# Patient Record
Sex: Female | Born: 1945 | Race: White | Hispanic: No | Smoking: Never smoker
Health system: Southern US, Community
[De-identification: ages and names within clinical notes are randomized; demographics above are authoritative.]

## PROBLEM LIST (undated history)

## (undated) DIAGNOSIS — C801 Malignant (primary) neoplasm, unspecified: Secondary | ICD-10-CM

## (undated) DIAGNOSIS — G809 Cerebral palsy, unspecified: Secondary | ICD-10-CM

## (undated) DIAGNOSIS — C50919 Malignant neoplasm of unspecified site of unspecified female breast: Secondary | ICD-10-CM

## (undated) HISTORY — PX: FRACTURE SURGERY: SHX138

## (undated) HISTORY — PX: ABDOMINAL HYSTERECTOMY: SHX81

## (undated) HISTORY — DX: Malignant (primary) neoplasm, unspecified: C80.1

## (undated) HISTORY — DX: Cerebral palsy, unspecified: G80.9

## (undated) HISTORY — PX: TUBAL LIGATION: SHX77

## (undated) HISTORY — PX: CATARACT EXTRACTION: SUR2

---

## 2008-04-28 DIAGNOSIS — C50919 Malignant neoplasm of unspecified site of unspecified female breast: Secondary | ICD-10-CM

## 2008-04-28 DIAGNOSIS — C801 Malignant (primary) neoplasm, unspecified: Secondary | ICD-10-CM

## 2008-04-28 HISTORY — PX: BREAST SURGERY: SHX581

## 2008-04-28 HISTORY — PX: BREAST LUMPECTOMY: SHX2

## 2008-04-28 HISTORY — DX: Malignant neoplasm of unspecified site of unspecified female breast: C50.919

## 2008-04-28 HISTORY — DX: Malignant (primary) neoplasm, unspecified: C80.1

## 2008-11-09 ENCOUNTER — Ambulatory Visit: Payer: Self-pay | Admitting: General Surgery

## 2008-11-16 ENCOUNTER — Ambulatory Visit: Payer: Self-pay | Admitting: General Surgery

## 2008-11-26 ENCOUNTER — Ambulatory Visit: Payer: Self-pay | Admitting: Oncology

## 2008-11-27 ENCOUNTER — Ambulatory Visit: Payer: Self-pay | Admitting: Internal Medicine

## 2008-12-27 ENCOUNTER — Ambulatory Visit: Payer: Self-pay | Admitting: Oncology

## 2008-12-27 ENCOUNTER — Ambulatory Visit: Payer: Self-pay | Admitting: Internal Medicine

## 2009-01-26 ENCOUNTER — Ambulatory Visit: Payer: Self-pay | Admitting: Oncology

## 2009-01-29 ENCOUNTER — Ambulatory Visit: Payer: Self-pay | Admitting: Oncology

## 2009-02-26 ENCOUNTER — Ambulatory Visit: Payer: Self-pay | Admitting: Oncology

## 2009-03-28 ENCOUNTER — Ambulatory Visit: Payer: Self-pay | Admitting: Oncology

## 2009-04-26 ENCOUNTER — Ambulatory Visit: Payer: Self-pay | Admitting: Oncology

## 2009-04-28 ENCOUNTER — Ambulatory Visit: Payer: Self-pay | Admitting: Oncology

## 2009-06-26 ENCOUNTER — Ambulatory Visit: Payer: Self-pay | Admitting: Oncology

## 2009-07-12 ENCOUNTER — Ambulatory Visit: Payer: Self-pay | Admitting: Oncology

## 2009-07-27 ENCOUNTER — Ambulatory Visit: Payer: Self-pay | Admitting: Oncology

## 2009-10-26 ENCOUNTER — Ambulatory Visit: Payer: Self-pay | Admitting: Oncology

## 2009-11-13 ENCOUNTER — Ambulatory Visit: Payer: Self-pay | Admitting: Oncology

## 2009-11-15 LAB — CANCER ANTIGEN 27.29: CA 27.29: 22 U/mL (ref 0.0–38.6)

## 2009-11-26 ENCOUNTER — Ambulatory Visit: Payer: Self-pay | Admitting: Oncology

## 2010-05-21 ENCOUNTER — Ambulatory Visit: Payer: Self-pay | Admitting: Oncology

## 2010-05-22 LAB — CANCER ANTIGEN 27.29: CA 27.29: 26.2 U/mL (ref 0.0–38.6)

## 2010-05-29 ENCOUNTER — Ambulatory Visit: Payer: Self-pay | Admitting: Oncology

## 2010-06-18 ENCOUNTER — Ambulatory Visit: Payer: Self-pay | Admitting: General Surgery

## 2010-07-08 ENCOUNTER — Ambulatory Visit: Payer: Self-pay | Admitting: Oncology

## 2010-07-28 ENCOUNTER — Ambulatory Visit: Payer: Self-pay | Admitting: Oncology

## 2010-11-19 ENCOUNTER — Ambulatory Visit: Payer: Self-pay | Admitting: Oncology

## 2010-11-27 ENCOUNTER — Ambulatory Visit: Payer: Self-pay | Admitting: Oncology

## 2010-12-17 ENCOUNTER — Ambulatory Visit: Payer: Self-pay | Admitting: General Surgery

## 2011-05-20 ENCOUNTER — Ambulatory Visit: Payer: Self-pay | Admitting: Oncology

## 2011-05-20 DIAGNOSIS — G808 Other cerebral palsy: Secondary | ICD-10-CM | POA: Diagnosis not present

## 2011-05-20 DIAGNOSIS — Z79811 Long term (current) use of aromatase inhibitors: Secondary | ICD-10-CM | POA: Diagnosis not present

## 2011-05-20 DIAGNOSIS — G114 Hereditary spastic paraplegia: Secondary | ICD-10-CM | POA: Diagnosis not present

## 2011-05-20 DIAGNOSIS — Z170 Estrogen receptor positive status [ER+]: Secondary | ICD-10-CM | POA: Diagnosis not present

## 2011-05-20 DIAGNOSIS — C50919 Malignant neoplasm of unspecified site of unspecified female breast: Secondary | ICD-10-CM | POA: Diagnosis not present

## 2011-05-20 DIAGNOSIS — Z79899 Other long term (current) drug therapy: Secondary | ICD-10-CM | POA: Diagnosis not present

## 2011-05-20 DIAGNOSIS — Z7982 Long term (current) use of aspirin: Secondary | ICD-10-CM | POA: Diagnosis not present

## 2011-05-30 ENCOUNTER — Ambulatory Visit: Payer: Self-pay | Admitting: Oncology

## 2011-06-04 DIAGNOSIS — Z853 Personal history of malignant neoplasm of breast: Secondary | ICD-10-CM | POA: Diagnosis not present

## 2011-06-04 DIAGNOSIS — Z09 Encounter for follow-up examination after completed treatment for conditions other than malignant neoplasm: Secondary | ICD-10-CM | POA: Diagnosis not present

## 2011-06-17 DIAGNOSIS — Z853 Personal history of malignant neoplasm of breast: Secondary | ICD-10-CM | POA: Diagnosis not present

## 2011-06-19 DIAGNOSIS — Z01818 Encounter for other preprocedural examination: Secondary | ICD-10-CM | POA: Diagnosis not present

## 2011-06-19 DIAGNOSIS — G808 Other cerebral palsy: Secondary | ICD-10-CM | POA: Diagnosis not present

## 2011-06-19 DIAGNOSIS — M24559 Contracture, unspecified hip: Secondary | ICD-10-CM | POA: Diagnosis not present

## 2011-06-19 DIAGNOSIS — Z01810 Encounter for preprocedural cardiovascular examination: Secondary | ICD-10-CM | POA: Diagnosis not present

## 2011-06-19 DIAGNOSIS — I10 Essential (primary) hypertension: Secondary | ICD-10-CM | POA: Diagnosis not present

## 2011-06-24 DIAGNOSIS — M6240 Contracture of muscle, unspecified site: Secondary | ICD-10-CM | POA: Diagnosis not present

## 2011-06-24 DIAGNOSIS — Z853 Personal history of malignant neoplasm of breast: Secondary | ICD-10-CM | POA: Diagnosis not present

## 2011-06-24 DIAGNOSIS — G809 Cerebral palsy, unspecified: Secondary | ICD-10-CM | POA: Diagnosis not present

## 2011-06-24 DIAGNOSIS — G8190 Hemiplegia, unspecified affecting unspecified side: Secondary | ICD-10-CM | POA: Diagnosis not present

## 2011-06-24 DIAGNOSIS — M24559 Contracture, unspecified hip: Secondary | ICD-10-CM | POA: Diagnosis not present

## 2011-06-24 DIAGNOSIS — I10 Essential (primary) hypertension: Secondary | ICD-10-CM | POA: Diagnosis not present

## 2011-06-24 DIAGNOSIS — G808 Other cerebral palsy: Secondary | ICD-10-CM | POA: Diagnosis not present

## 2011-06-24 DIAGNOSIS — Z79899 Other long term (current) drug therapy: Secondary | ICD-10-CM | POA: Diagnosis not present

## 2011-07-11 DIAGNOSIS — G808 Other cerebral palsy: Secondary | ICD-10-CM | POA: Diagnosis not present

## 2011-07-21 DIAGNOSIS — I69959 Hemiplegia and hemiparesis following unspecified cerebrovascular disease affecting unspecified side: Secondary | ICD-10-CM | POA: Diagnosis not present

## 2011-07-21 DIAGNOSIS — M62838 Other muscle spasm: Secondary | ICD-10-CM | POA: Diagnosis not present

## 2011-09-01 DIAGNOSIS — M2170 Unequal limb length (acquired), unspecified site: Secondary | ICD-10-CM | POA: Diagnosis not present

## 2011-09-01 DIAGNOSIS — G808 Other cerebral palsy: Secondary | ICD-10-CM | POA: Diagnosis not present

## 2011-09-01 DIAGNOSIS — M24559 Contracture, unspecified hip: Secondary | ICD-10-CM | POA: Diagnosis not present

## 2011-10-10 DIAGNOSIS — Z0000 Encounter for general adult medical examination without abnormal findings: Secondary | ICD-10-CM | POA: Diagnosis not present

## 2011-10-10 DIAGNOSIS — I10 Essential (primary) hypertension: Secondary | ICD-10-CM | POA: Diagnosis not present

## 2011-10-10 DIAGNOSIS — C50919 Malignant neoplasm of unspecified site of unspecified female breast: Secondary | ICD-10-CM | POA: Diagnosis not present

## 2011-10-10 DIAGNOSIS — G808 Other cerebral palsy: Secondary | ICD-10-CM | POA: Diagnosis not present

## 2011-11-05 DIAGNOSIS — I10 Essential (primary) hypertension: Secondary | ICD-10-CM | POA: Diagnosis not present

## 2011-11-05 DIAGNOSIS — Z0000 Encounter for general adult medical examination without abnormal findings: Secondary | ICD-10-CM | POA: Diagnosis not present

## 2011-12-08 DIAGNOSIS — I10 Essential (primary) hypertension: Secondary | ICD-10-CM | POA: Diagnosis not present

## 2012-01-05 DIAGNOSIS — Z23 Encounter for immunization: Secondary | ICD-10-CM | POA: Diagnosis not present

## 2012-01-20 ENCOUNTER — Ambulatory Visit: Payer: Self-pay | Admitting: Oncology

## 2012-01-20 DIAGNOSIS — Z79899 Other long term (current) drug therapy: Secondary | ICD-10-CM | POA: Diagnosis not present

## 2012-01-20 DIAGNOSIS — G808 Other cerebral palsy: Secondary | ICD-10-CM | POA: Diagnosis not present

## 2012-01-20 DIAGNOSIS — Z7982 Long term (current) use of aspirin: Secondary | ICD-10-CM | POA: Diagnosis not present

## 2012-01-20 DIAGNOSIS — C50919 Malignant neoplasm of unspecified site of unspecified female breast: Secondary | ICD-10-CM | POA: Diagnosis not present

## 2012-01-20 DIAGNOSIS — Z170 Estrogen receptor positive status [ER+]: Secondary | ICD-10-CM | POA: Diagnosis not present

## 2012-01-20 DIAGNOSIS — Z79811 Long term (current) use of aromatase inhibitors: Secondary | ICD-10-CM | POA: Diagnosis not present

## 2012-01-22 DIAGNOSIS — G809 Cerebral palsy, unspecified: Secondary | ICD-10-CM | POA: Diagnosis not present

## 2012-01-27 ENCOUNTER — Ambulatory Visit: Payer: Self-pay | Admitting: Oncology

## 2012-04-08 DIAGNOSIS — I10 Essential (primary) hypertension: Secondary | ICD-10-CM | POA: Diagnosis not present

## 2012-05-10 DIAGNOSIS — I10 Essential (primary) hypertension: Secondary | ICD-10-CM | POA: Diagnosis not present

## 2012-06-08 DIAGNOSIS — Z09 Encounter for follow-up examination after completed treatment for conditions other than malignant neoplasm: Secondary | ICD-10-CM | POA: Diagnosis not present

## 2012-06-08 DIAGNOSIS — Z853 Personal history of malignant neoplasm of breast: Secondary | ICD-10-CM | POA: Diagnosis not present

## 2012-06-15 DIAGNOSIS — Z853 Personal history of malignant neoplasm of breast: Secondary | ICD-10-CM | POA: Diagnosis not present

## 2012-07-19 ENCOUNTER — Ambulatory Visit: Payer: Self-pay | Admitting: Oncology

## 2012-07-19 DIAGNOSIS — C50919 Malignant neoplasm of unspecified site of unspecified female breast: Secondary | ICD-10-CM | POA: Diagnosis not present

## 2012-07-19 DIAGNOSIS — Z90710 Acquired absence of both cervix and uterus: Secondary | ICD-10-CM | POA: Diagnosis not present

## 2012-07-19 DIAGNOSIS — Z170 Estrogen receptor positive status [ER+]: Secondary | ICD-10-CM | POA: Diagnosis not present

## 2012-07-19 DIAGNOSIS — Z79811 Long term (current) use of aromatase inhibitors: Secondary | ICD-10-CM | POA: Diagnosis not present

## 2012-07-19 DIAGNOSIS — G808 Other cerebral palsy: Secondary | ICD-10-CM | POA: Diagnosis not present

## 2012-07-19 DIAGNOSIS — Z7982 Long term (current) use of aspirin: Secondary | ICD-10-CM | POA: Diagnosis not present

## 2012-07-19 DIAGNOSIS — G809 Cerebral palsy, unspecified: Secondary | ICD-10-CM | POA: Diagnosis not present

## 2012-07-19 DIAGNOSIS — Z79899 Other long term (current) drug therapy: Secondary | ICD-10-CM | POA: Diagnosis not present

## 2012-07-20 DIAGNOSIS — Z170 Estrogen receptor positive status [ER+]: Secondary | ICD-10-CM | POA: Diagnosis not present

## 2012-07-20 DIAGNOSIS — C50919 Malignant neoplasm of unspecified site of unspecified female breast: Secondary | ICD-10-CM | POA: Diagnosis not present

## 2012-07-20 DIAGNOSIS — Z79811 Long term (current) use of aromatase inhibitors: Secondary | ICD-10-CM | POA: Diagnosis not present

## 2012-07-27 ENCOUNTER — Ambulatory Visit: Payer: Self-pay | Admitting: Oncology

## 2012-10-27 ENCOUNTER — Encounter: Payer: Self-pay | Admitting: *Deleted

## 2012-10-27 DIAGNOSIS — C50419 Malignant neoplasm of upper-outer quadrant of unspecified female breast: Secondary | ICD-10-CM | POA: Insufficient documentation

## 2012-11-17 DIAGNOSIS — Z0000 Encounter for general adult medical examination without abnormal findings: Secondary | ICD-10-CM | POA: Diagnosis not present

## 2012-11-17 DIAGNOSIS — I10 Essential (primary) hypertension: Secondary | ICD-10-CM | POA: Diagnosis not present

## 2012-11-18 DIAGNOSIS — G808 Other cerebral palsy: Secondary | ICD-10-CM | POA: Diagnosis not present

## 2012-11-18 DIAGNOSIS — Z0000 Encounter for general adult medical examination without abnormal findings: Secondary | ICD-10-CM | POA: Diagnosis not present

## 2012-11-23 DIAGNOSIS — H5210 Myopia, unspecified eye: Secondary | ICD-10-CM | POA: Diagnosis not present

## 2012-11-23 DIAGNOSIS — H524 Presbyopia: Secondary | ICD-10-CM | POA: Diagnosis not present

## 2012-11-23 DIAGNOSIS — H52229 Regular astigmatism, unspecified eye: Secondary | ICD-10-CM | POA: Diagnosis not present

## 2012-11-23 DIAGNOSIS — H269 Unspecified cataract: Secondary | ICD-10-CM | POA: Diagnosis not present

## 2013-01-10 DIAGNOSIS — M62838 Other muscle spasm: Secondary | ICD-10-CM | POA: Diagnosis not present

## 2013-01-10 DIAGNOSIS — R259 Unspecified abnormal involuntary movements: Secondary | ICD-10-CM | POA: Diagnosis not present

## 2013-01-10 DIAGNOSIS — G809 Cerebral palsy, unspecified: Secondary | ICD-10-CM | POA: Diagnosis not present

## 2013-01-12 DIAGNOSIS — Z23 Encounter for immunization: Secondary | ICD-10-CM | POA: Diagnosis not present

## 2013-01-17 ENCOUNTER — Ambulatory Visit: Payer: Self-pay | Admitting: Oncology

## 2013-01-17 DIAGNOSIS — Z90710 Acquired absence of both cervix and uterus: Secondary | ICD-10-CM | POA: Diagnosis not present

## 2013-01-17 DIAGNOSIS — Z170 Estrogen receptor positive status [ER+]: Secondary | ICD-10-CM | POA: Diagnosis not present

## 2013-01-17 DIAGNOSIS — Z79899 Other long term (current) drug therapy: Secondary | ICD-10-CM | POA: Diagnosis not present

## 2013-01-17 DIAGNOSIS — G809 Cerebral palsy, unspecified: Secondary | ICD-10-CM | POA: Diagnosis not present

## 2013-01-17 DIAGNOSIS — C50919 Malignant neoplasm of unspecified site of unspecified female breast: Secondary | ICD-10-CM | POA: Diagnosis not present

## 2013-01-17 DIAGNOSIS — Z7982 Long term (current) use of aspirin: Secondary | ICD-10-CM | POA: Diagnosis not present

## 2013-01-17 DIAGNOSIS — Z79811 Long term (current) use of aromatase inhibitors: Secondary | ICD-10-CM | POA: Diagnosis not present

## 2013-01-18 DIAGNOSIS — Z170 Estrogen receptor positive status [ER+]: Secondary | ICD-10-CM | POA: Diagnosis not present

## 2013-01-18 DIAGNOSIS — Z79811 Long term (current) use of aromatase inhibitors: Secondary | ICD-10-CM | POA: Diagnosis not present

## 2013-01-18 DIAGNOSIS — C50919 Malignant neoplasm of unspecified site of unspecified female breast: Secondary | ICD-10-CM | POA: Diagnosis not present

## 2013-01-26 ENCOUNTER — Ambulatory Visit: Payer: Self-pay | Admitting: Oncology

## 2013-05-13 DIAGNOSIS — I10 Essential (primary) hypertension: Secondary | ICD-10-CM | POA: Diagnosis not present

## 2013-06-16 ENCOUNTER — Ambulatory Visit: Payer: Self-pay | Admitting: General Surgery

## 2013-06-29 ENCOUNTER — Ambulatory Visit: Payer: Medicaid Other | Admitting: General Surgery

## 2013-06-30 DIAGNOSIS — Z9889 Other specified postprocedural states: Secondary | ICD-10-CM | POA: Diagnosis not present

## 2013-06-30 DIAGNOSIS — Z853 Personal history of malignant neoplasm of breast: Secondary | ICD-10-CM | POA: Diagnosis not present

## 2013-06-30 DIAGNOSIS — G809 Cerebral palsy, unspecified: Secondary | ICD-10-CM | POA: Diagnosis not present

## 2013-06-30 DIAGNOSIS — Z09 Encounter for follow-up examination after completed treatment for conditions other than malignant neoplasm: Secondary | ICD-10-CM | POA: Diagnosis not present

## 2013-07-01 ENCOUNTER — Encounter: Payer: Self-pay | Admitting: General Surgery

## 2013-07-06 ENCOUNTER — Encounter: Payer: Self-pay | Admitting: General Surgery

## 2013-07-06 ENCOUNTER — Ambulatory Visit (INDEPENDENT_AMBULATORY_CARE_PROVIDER_SITE_OTHER): Payer: Medicare Other | Admitting: General Surgery

## 2013-07-06 VITALS — BP 116/72 | HR 80 | Resp 16 | Ht 62.0 in | Wt 108.0 lb

## 2013-07-06 DIAGNOSIS — C50419 Malignant neoplasm of upper-outer quadrant of unspecified female breast: Secondary | ICD-10-CM

## 2013-07-06 DIAGNOSIS — C50411 Malignant neoplasm of upper-outer quadrant of right female breast: Secondary | ICD-10-CM

## 2013-07-06 DIAGNOSIS — Z853 Personal history of malignant neoplasm of breast: Secondary | ICD-10-CM

## 2013-07-06 NOTE — Progress Notes (Signed)
[name] ID: Suzanne Jordan, female   DOB: 1946-02-22, 68 y.o.   MRN: [id number]  Chief Complaint  Patient presents with  . Follow-up    mammogram    HPI Suzanne Jordan is a 68 y.o. female who presents for a breast evaluation. The most recent mammogram was done on 06/30/13. Patient does perform regular self breast checks and gets regular mammograms done.  No new breast complaints. Bowels move daily, denies bloody stools.  HPI  Past Medical History  Diagnosis Date  . Cancer 2010    right breast managed by lumpectomy, sn bx and partial breast radiation from July-Sept 2013. 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 On wide excision there was a 1.5cm area of invasive cancer. ER & PR testing was positive. Oncotype Dx assay showed a low recurrence risk of 10%  . Hypertension 2012  . Cerebral palsy     at birth  . 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 Breast surgery Right 2010    wide excision with sn biopsy  . Fracture surgery Right     right leg, as a child  . Abdominal hysterectomy  1970's  . Tubal ligation      Family History  Problem Relation Age of Onset  . Stroke Father     passed Jan 2014 age 76  . Cancer Other     colon,breast,ovarian cancers, relationships not listed    Social History History  Substance Use Topics  . Smoking status: Never Smoker   . Smokeless tobacco: Never Used  . Alcohol Use: No    No Known Allergies  Current Outpatient Prescriptions  Medication Sig Dispense Refill  . aspirin 81 MG tablet Take 81 mg by mouth daily.      . benazepril (LOTENSIN) 40 MG tablet       . diazepam (VALIUM) 10 MG tablet       . hydrochlorothiazide (MICROZIDE) 12.5 MG capsule       . Multiple Vitamin (MULTIVITAMIN) capsule Take 1 capsule by mouth daily.      . verapamil (CALAN-SR) 240 MG CR tablet        No current facility-administered medications for this visit.     Review of Systems Review of Systems  Constitutional: Negative.   Respiratory: Negative.   Cardiovascular: Negative.     Blood pressure 116/72, pulse 80, resp. rate 16, height 5\' 2"  (1.575 m), weight 108 lb (48.988 kg).  Physical Exam Physical Exam  Constitutional: She is oriented to person, place, and time. She appears well-nourished.  Neck: Neck supple.  Cardiovascular: Normal rate, regular rhythm and normal heart sounds.   Pulmonary/Chest: Effort normal and breath sounds normal. Right breast exhibits no inverted nipple, no mass, no nipple discharge and no tenderness. Left breast exhibits no inverted nipple, no mass, no nipple discharge, no skin change and no tenderness.  3 x 3 area of telangectasia  Moderate volume loss left breast  Lymphadenopathy:    She has no cervical adenopathy.    She has no axillary adenopathy.  Neurological: She is alert and oriented to person, place, and time.  Skin: Skin is warm and dry.    Data Reviewed Bilateral mammograms date June 30, 2013 were completed, the examination was compromised due the patient's inability to stand. These images were reviewed.  BI-RAD-2.  Assessment    No evident recurrent disease now 5-1/2 years after breast conservation.  Plan    We'll plan for repeat examination with bilateral diagnostic mammograms in one year.  The patient declined to consider antiestrogen therapy. She also declined to consider a colonoscopy. Her increased risk based on her age and previous breast cancer was discussed with her.         Robert Bellow 07/09/2013, 3:18 PM   A

## 2013-07-06 NOTE — Patient Instructions (Addendum)
Patient to return in one year bilateral diagnotic mammogram.  Continue self breast exams. Call office for any new breast issues or concerns.  

## 2013-07-07 DIAGNOSIS — G809 Cerebral palsy, unspecified: Secondary | ICD-10-CM | POA: Diagnosis not present

## 2013-07-07 DIAGNOSIS — R259 Unspecified abnormal involuntary movements: Secondary | ICD-10-CM | POA: Diagnosis not present

## 2013-07-07 DIAGNOSIS — M62838 Other muscle spasm: Secondary | ICD-10-CM | POA: Diagnosis not present

## 2013-07-09 ENCOUNTER — Encounter: Payer: Self-pay | Admitting: General Surgery

## 2013-07-11 ENCOUNTER — Ambulatory Visit: Payer: Self-pay | Admitting: Oncology

## 2013-07-11 DIAGNOSIS — Z170 Estrogen receptor positive status [ER+]: Secondary | ICD-10-CM | POA: Diagnosis not present

## 2013-07-11 DIAGNOSIS — Z90710 Acquired absence of both cervix and uterus: Secondary | ICD-10-CM | POA: Diagnosis not present

## 2013-07-11 DIAGNOSIS — Z79899 Other long term (current) drug therapy: Secondary | ICD-10-CM | POA: Diagnosis not present

## 2013-07-11 DIAGNOSIS — Z7982 Long term (current) use of aspirin: Secondary | ICD-10-CM | POA: Diagnosis not present

## 2013-07-11 DIAGNOSIS — C50919 Malignant neoplasm of unspecified site of unspecified female breast: Secondary | ICD-10-CM | POA: Diagnosis not present

## 2013-07-11 DIAGNOSIS — G809 Cerebral palsy, unspecified: Secondary | ICD-10-CM | POA: Diagnosis not present

## 2013-07-11 DIAGNOSIS — Z79811 Long term (current) use of aromatase inhibitors: Secondary | ICD-10-CM | POA: Diagnosis not present

## 2013-07-14 ENCOUNTER — Encounter: Payer: Self-pay | Admitting: General Surgery

## 2013-07-14 DIAGNOSIS — Z79899 Other long term (current) drug therapy: Secondary | ICD-10-CM | POA: Diagnosis not present

## 2013-07-14 DIAGNOSIS — Z7982 Long term (current) use of aspirin: Secondary | ICD-10-CM | POA: Diagnosis not present

## 2013-07-14 DIAGNOSIS — G809 Cerebral palsy, unspecified: Secondary | ICD-10-CM | POA: Diagnosis not present

## 2013-07-14 DIAGNOSIS — Z79811 Long term (current) use of aromatase inhibitors: Secondary | ICD-10-CM | POA: Diagnosis not present

## 2013-07-14 DIAGNOSIS — Z170 Estrogen receptor positive status [ER+]: Secondary | ICD-10-CM | POA: Diagnosis not present

## 2013-07-14 DIAGNOSIS — C50919 Malignant neoplasm of unspecified site of unspecified female breast: Secondary | ICD-10-CM | POA: Diagnosis not present

## 2013-07-21 DIAGNOSIS — Z462 Encounter for fitting and adjustment of other devices related to nervous system and special senses: Secondary | ICD-10-CM | POA: Diagnosis not present

## 2013-07-21 DIAGNOSIS — C50919 Malignant neoplasm of unspecified site of unspecified female breast: Secondary | ICD-10-CM | POA: Diagnosis not present

## 2013-07-21 DIAGNOSIS — R259 Unspecified abnormal involuntary movements: Secondary | ICD-10-CM | POA: Diagnosis not present

## 2013-07-21 DIAGNOSIS — G808 Other cerebral palsy: Secondary | ICD-10-CM | POA: Diagnosis not present

## 2013-07-21 DIAGNOSIS — Z419 Encounter for procedure for purposes other than remedying health state, unspecified: Secondary | ICD-10-CM | POA: Diagnosis not present

## 2013-07-21 DIAGNOSIS — G809 Cerebral palsy, unspecified: Secondary | ICD-10-CM | POA: Diagnosis not present

## 2013-07-21 DIAGNOSIS — I10 Essential (primary) hypertension: Secondary | ICD-10-CM | POA: Diagnosis not present

## 2013-07-21 DIAGNOSIS — M62838 Other muscle spasm: Secondary | ICD-10-CM | POA: Diagnosis not present

## 2013-07-21 DIAGNOSIS — Z79899 Other long term (current) drug therapy: Secondary | ICD-10-CM | POA: Diagnosis not present

## 2013-07-27 ENCOUNTER — Ambulatory Visit: Payer: Self-pay | Admitting: Oncology

## 2013-08-03 DIAGNOSIS — M62838 Other muscle spasm: Secondary | ICD-10-CM | POA: Diagnosis not present

## 2013-08-03 DIAGNOSIS — I10 Essential (primary) hypertension: Secondary | ICD-10-CM | POA: Diagnosis not present

## 2013-08-03 DIAGNOSIS — G809 Cerebral palsy, unspecified: Secondary | ICD-10-CM | POA: Diagnosis not present

## 2013-08-03 DIAGNOSIS — R259 Unspecified abnormal involuntary movements: Secondary | ICD-10-CM | POA: Diagnosis not present

## 2013-08-24 DIAGNOSIS — Z5189 Encounter for other specified aftercare: Secondary | ICD-10-CM | POA: Diagnosis not present

## 2013-11-23 DIAGNOSIS — B351 Tinea unguium: Secondary | ICD-10-CM | POA: Diagnosis not present

## 2013-11-23 DIAGNOSIS — I10 Essential (primary) hypertension: Secondary | ICD-10-CM | POA: Diagnosis not present

## 2013-11-23 DIAGNOSIS — Z0000 Encounter for general adult medical examination without abnormal findings: Secondary | ICD-10-CM | POA: Diagnosis not present

## 2013-11-23 DIAGNOSIS — Z23 Encounter for immunization: Secondary | ICD-10-CM | POA: Diagnosis not present

## 2013-11-23 DIAGNOSIS — G808 Other cerebral palsy: Secondary | ICD-10-CM | POA: Diagnosis not present

## 2014-01-06 DIAGNOSIS — G808 Other cerebral palsy: Secondary | ICD-10-CM | POA: Diagnosis not present

## 2014-01-06 DIAGNOSIS — R259 Unspecified abnormal involuntary movements: Secondary | ICD-10-CM | POA: Diagnosis not present

## 2014-01-17 ENCOUNTER — Ambulatory Visit: Payer: Self-pay | Admitting: Oncology

## 2014-01-17 DIAGNOSIS — C50919 Malignant neoplasm of unspecified site of unspecified female breast: Secondary | ICD-10-CM | POA: Diagnosis not present

## 2014-01-17 DIAGNOSIS — Z170 Estrogen receptor positive status [ER+]: Secondary | ICD-10-CM | POA: Diagnosis not present

## 2014-01-17 DIAGNOSIS — Z90710 Acquired absence of both cervix and uterus: Secondary | ICD-10-CM | POA: Diagnosis not present

## 2014-01-17 DIAGNOSIS — G809 Cerebral palsy, unspecified: Secondary | ICD-10-CM | POA: Diagnosis not present

## 2014-01-17 DIAGNOSIS — Z7982 Long term (current) use of aspirin: Secondary | ICD-10-CM | POA: Diagnosis not present

## 2014-01-17 DIAGNOSIS — Z79811 Long term (current) use of aromatase inhibitors: Secondary | ICD-10-CM | POA: Diagnosis not present

## 2014-01-17 DIAGNOSIS — Z79899 Other long term (current) drug therapy: Secondary | ICD-10-CM | POA: Diagnosis not present

## 2014-01-18 DIAGNOSIS — Z23 Encounter for immunization: Secondary | ICD-10-CM | POA: Diagnosis not present

## 2014-01-26 ENCOUNTER — Ambulatory Visit: Payer: Self-pay | Admitting: Oncology

## 2014-02-27 ENCOUNTER — Encounter: Payer: Self-pay | Admitting: General Surgery

## 2014-05-15 DIAGNOSIS — I10 Essential (primary) hypertension: Secondary | ICD-10-CM | POA: Diagnosis not present

## 2014-05-15 DIAGNOSIS — G809 Cerebral palsy, unspecified: Secondary | ICD-10-CM | POA: Diagnosis not present

## 2014-06-30 DIAGNOSIS — Z853 Personal history of malignant neoplasm of breast: Secondary | ICD-10-CM | POA: Diagnosis not present

## 2014-06-30 DIAGNOSIS — R928 Other abnormal and inconclusive findings on diagnostic imaging of breast: Secondary | ICD-10-CM | POA: Diagnosis not present

## 2014-06-30 DIAGNOSIS — Z08 Encounter for follow-up examination after completed treatment for malignant neoplasm: Secondary | ICD-10-CM | POA: Diagnosis not present

## 2014-07-03 ENCOUNTER — Encounter: Payer: Self-pay | Admitting: General Surgery

## 2014-07-06 DIAGNOSIS — R252 Cramp and spasm: Secondary | ICD-10-CM | POA: Diagnosis not present

## 2014-07-06 DIAGNOSIS — G808 Other cerebral palsy: Secondary | ICD-10-CM | POA: Diagnosis not present

## 2014-07-10 ENCOUNTER — Encounter: Payer: Self-pay | Admitting: General Surgery

## 2014-07-10 ENCOUNTER — Ambulatory Visit (INDEPENDENT_AMBULATORY_CARE_PROVIDER_SITE_OTHER): Payer: Medicare Other | Admitting: General Surgery

## 2014-07-10 VITALS — BP 110/68 | HR 68 | Resp 12 | Ht 62.0 in | Wt 110.0 lb

## 2014-07-10 DIAGNOSIS — Z853 Personal history of malignant neoplasm of breast: Secondary | ICD-10-CM

## 2014-07-10 NOTE — Progress Notes (Signed)
Patient ID: Suzanne Jordan, female   DOB: Feb 13, 1946, 69 y.o.   MRN: [id number]  Chief Complaint  Patient presents with  . Follow-up    mammogram    HPI Suzanne Jordan is a 69 y.o. female who presents for a breast evaluation. The most recent mammogram was done on 06/30/14.Patient does perform regular self breast checks and gets regular mammograms done. She reports no new breast problems.   HPI  Past Medical History  Diagnosis Date  . Hypertension 2012  . Cerebral palsy     at birth  . Cancer 2010    right breast managed by lumpectomy, sn bx, T1c, N0, ER+. PR+ , her 2 neu not overexpressed. Partial breast radiation from Aug-Sept 2000. 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 On wide excision there was a 1.5cm area of invasive cancer. ER & PR testing was positive. Oncotype Dx assay showed a low recurrence risk of 10%. She declined antiestrogen therapy.  . 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 Breast surgery Right 2010    wide excision with sn biopsy  . Fracture surgery Right     right leg, as a child  . Abdominal hysterectomy  1970's  . Tubal ligation      Family History  Problem Relation Age of Onset  . Stroke Father     passed Jan 2014 age 105  . Cancer Other     colon,breast,ovarian cancers, relationships not listed    Social History History  Substance Use Topics  . Smoking status: Never Smoker   . Smokeless tobacco: Never Used  . Alcohol Use: No    No Known Allergies  Current Outpatient Prescriptions  Medication Sig Dispense Refill  . aspirin 81 MG tablet Take 81 mg by mouth daily.    . benazepril (LOTENSIN) 40 MG tablet     . diazepam (VALIUM) 10 MG tablet     . hydrochlorothiazide (MICROZIDE) 12.5 MG capsule     . Multiple Vitamin (MULTIVITAMIN) capsule Take 1 capsule by mouth daily.    . verapamil (CALAN-SR) 240 MG CR tablet      No current facility-administered  medications for this visit.    Review of Systems Review of Systems  Blood pressure 110/68, pulse 68, resp. rate 12, height 5\' 2"  (1.575 m), weight 110 lb (49.896 kg).  Physical Exam Physical Exam  Constitutional: She is oriented to person, place, and time. She appears well-nourished.  Eyes: Conjunctivae are normal. No scleral icterus.  Neck: Neck supple.  Cardiovascular: Normal rate, regular rhythm and normal heart sounds.   Pulmonary/Chest: Effort normal and breath sounds normal. Right breast exhibits no inverted nipple, no mass, no nipple discharge, no skin change and no tenderness. Left breast exhibits no inverted nipple, no mass, no nipple discharge, no skin change and no tenderness.  Thickening on the right breast   Abdominal: Soft. Bowel sounds are normal. There is no tenderness.  Lymphadenopathy:    She has no cervical adenopathy.    She has no axillary adenopathy.  Neurological: She is alert and oriented to person, place, and time.  Skin: Skin is warm and dry.    Data Reviewed Bilateral mammograms dated 06/30/2014 completed at [facility] were reviewed. Post biopsy changes noted. No interval change. BI-RADS-2.  Assessment    Benign breast exam.    Plan    The patient has been asked to return to the office  in one year with a bilateral diagnostic mammogram. The patient had declined postoperative adjuvant hormonal therapy. She again declines screening colonoscopy.    PCP: Dr Kirkland Hun 07/11/2014, 9:01 PM

## 2014-07-10 NOTE — Patient Instructions (Signed)
The patient has been asked to return to the office in one year with a bilateral diagnostic mammogram. 

## 2014-07-11 ENCOUNTER — Encounter: Payer: Self-pay | Admitting: General Surgery

## 2014-09-21 DIAGNOSIS — H2512 Age-related nuclear cataract, left eye: Secondary | ICD-10-CM | POA: Diagnosis not present

## 2014-10-26 ENCOUNTER — Telehealth: Payer: Self-pay | Admitting: Family Medicine

## 2014-10-26 ENCOUNTER — Telehealth: Payer: Self-pay

## 2014-10-26 MED ORDER — DIAZEPAM 10 MG PO TABS: 10.0000 mg | ORAL_TABLET | Freq: Two times a day (BID) | ORAL | Status: AC | PRN
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
## 2014-10-26 NOTE — Telephone Encounter (Signed)
Rx called in [place]
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
## 2014-10-26 NOTE — Telephone Encounter (Signed)
approved

## 2014-11-09 DIAGNOSIS — H2512 Age-related nuclear cataract, left eye: Secondary | ICD-10-CM | POA: Diagnosis not present

## 2014-11-20 DIAGNOSIS — I10 Essential (primary) hypertension: Secondary | ICD-10-CM | POA: Diagnosis not present

## 2014-11-20 DIAGNOSIS — H2512 Age-related nuclear cataract, left eye: Secondary | ICD-10-CM | POA: Diagnosis not present

## 2014-11-23 ENCOUNTER — Telehealth: Payer: Self-pay | Admitting: Family Medicine

## 2014-11-23 NOTE — Telephone Encounter (Signed)
noted 

## 2014-11-23 NOTE — Telephone Encounter (Signed)
Pt wants [facility] to know that the reason her appt for a CPE is so far out is due to cataract surgery. Thanks.

## 2014-11-29 DIAGNOSIS — H2511 Age-related nuclear cataract, right eye: Secondary | ICD-10-CM | POA: Diagnosis not present

## 2014-12-04 DIAGNOSIS — H2511 Age-related nuclear cataract, right eye: Secondary | ICD-10-CM | POA: Diagnosis not present

## 2014-12-21 DIAGNOSIS — Z09 Encounter for follow-up examination after completed treatment for conditions other than malignant neoplasm: Secondary | ICD-10-CM | POA: Diagnosis not present

## 2015-01-04 DIAGNOSIS — G808 Other cerebral palsy: Secondary | ICD-10-CM | POA: Diagnosis not present

## 2015-01-04 DIAGNOSIS — R252 Cramp and spasm: Secondary | ICD-10-CM | POA: Diagnosis not present

## 2015-01-11 DIAGNOSIS — Z23 Encounter for immunization: Secondary | ICD-10-CM | POA: Diagnosis not present

## 2015-01-15 ENCOUNTER — Telehealth: Payer: Self-pay

## 2015-01-15 MED ORDER — VERAPAMIL HCL ER 240 MG PO TBCR
240.0000 mg | EXTENDED_RELEASE_TABLET | Freq: Every day | ORAL | Status: DC
Start: 2015-01-15 — End: 2015-01-24

## 2015-01-15 NOTE — Telephone Encounter (Signed)
[facility] requesting refill Verapamil HCL ER 240MG 

## 2015-01-24 ENCOUNTER — Ambulatory Visit (INDEPENDENT_AMBULATORY_CARE_PROVIDER_SITE_OTHER): Payer: Medicare Other | Admitting: Family Medicine

## 2015-01-24 ENCOUNTER — Encounter: Payer: Self-pay | Admitting: Family Medicine

## 2015-01-24 VITALS — BP 135/62 | HR 87 | Temp 98.3°F | Ht 62.0 in | Wt 107.0 lb

## 2015-01-24 DIAGNOSIS — Z853 Personal history of malignant neoplasm of breast: Secondary | ICD-10-CM | POA: Diagnosis not present

## 2015-01-24 DIAGNOSIS — I10 Essential (primary) hypertension: Secondary | ICD-10-CM

## 2015-01-24 DIAGNOSIS — Z0000 Encounter for general adult medical examination without abnormal findings: Secondary | ICD-10-CM

## 2015-01-24 DIAGNOSIS — G809 Cerebral palsy, unspecified: Secondary | ICD-10-CM | POA: Diagnosis not present

## 2015-01-24 LAB — URINALYSIS, ROUTINE W REFLEX MICROSCOPIC
Bilirubin, UA: NEGATIVE
Glucose, UA: NEGATIVE
Ketones, UA: NEGATIVE
NITRITE UA: NEGATIVE
PH UA: 6 (ref 5.0–7.5)
Protein, UA: NEGATIVE
Specific Gravity, UA: 1.005 (ref 1.005–1.030)
UUROB: 0.2 mg/dL (ref 0.2–1.0)

## 2015-01-24 LAB — MICROSCOPIC EXAMINATION: RENAL EPITHEL UA: NONE SEEN /HPF

## 2015-01-24 MED ORDER — HYDROCHLOROTHIAZIDE 12.5 MG PO CAPS: 12.5000 mg | ORAL_CAPSULE | Freq: Every day | ORAL | Status: DC

## 2015-01-24 MED ORDER — VERAPAMIL HCL ER 240 MG PO TBCR: 240.0000 mg | EXTENDED_RELEASE_TABLET | Freq: Every day | ORAL | Status: DC

## 2015-01-24 MED ORDER — BENAZEPRIL HCL 40 MG PO TABS: 40.0000 mg | ORAL_TABLET | Freq: Every day | ORAL | Status: DC

## 2015-01-24 NOTE — Progress Notes (Addendum)
BP 135/62 mmHg  Pulse 87  Temp(Src) 98.3 F (36.8 C)  Ht 5\' 2"  (1.575 m)  Wt 107 lb (48.535 kg)  BMI 19.57 kg/m2  SpO2 95%   Subjective:    Patient ID: Suzanne Jordan, female    DOB: 08/07/1945, 69 y.o.   MRN: [id number]  HPI: Suzanne Jordan is a 69 y.o. female  Chief Complaint  Patient presents with  . Annual Exam   patient follow-up doing well cerebral palsy stable taking Valium from her neurologist at [facility] and doing well. No complaints is in a wheelchair and accompanied by caregiver.  Breast cancer completely resolved has finished suppressive therapy after 5 years.  Hypertension doing well no complaints from medication takes meds faithfully with no side effects.  Relevant past medical, surgical, family and social history reviewed and updated as indicated. Interim medical history since our last visit reviewed. Allergies and medications reviewed and updated.  Review of Systems  Constitutional: Negative.   HENT: Negative.   Eyes: Negative.   Respiratory: Negative.   Cardiovascular: Negative.   Gastrointestinal: Negative.   Endocrine: Negative.   Genitourinary: Negative.   Musculoskeletal: Negative.   Skin: Negative.   Allergic/Immunologic: Negative.   Neurological: Negative.   Hematological: Negative.   Psychiatric/Behavioral: Negative.     Per HPI unless specifically indicated above     Objective:    BP 135/62 mmHg  Pulse 87  Temp(Src) 98.3 F (36.8 C)  Ht 5\' 2"  (1.575 m)  Wt 107 lb (48.535 kg)  BMI 19.57 kg/m2  SpO2 95%  Wt Readings from Last 3 Encounters:  01/24/15 107 lb (48.535 kg)  07/10/14 110 lb (49.896 kg)  07/06/13 108 lb (48.988 kg)    Physical Exam  Constitutional: She is oriented to person, place, and time. She appears well-developed and well-nourished.  HENT:  Head: Normocephalic and atraumatic.  Right Ear: External ear normal.  Left Ear: External ear normal.  Nose: Nose normal.  Mouth/Throat: Oropharynx is clear and moist.   Eyes: Conjunctivae and EOM are normal. Pupils are equal, round, and reactive to light.  Neck: Normal range of motion. Neck supple. Carotid bruit is not present.  Cardiovascular: Normal rate, regular rhythm and normal heart sounds.   No murmur heard. Pulmonary/Chest: Effort normal and breath sounds normal.  Abdominal: Soft. Bowel sounds are normal. There is no hepatosplenomegaly.  Right lower quadrant baclofen pump  Musculoskeletal: Normal range of motion.  Right-sided weakness with spasms  Neurological: She is alert and oriented to person, place, and time.  Skin: No rash noted.  Psychiatric: She has a normal mood and affect. Her behavior is normal. Judgment and thought content normal.    Results for orders placed or performed in visit on 01/24/15  Microscopic Examination  Result Value Ref Range   WBC, UA 0-5 0 -  5 /hpf   RBC, UA 0-2 0 -  2 /hpf   Epithelial Cells (non renal) 0-10 0 - 10 /hpf   Renal Epithel, UA None seen None seen /hpf   Bacteria, UA Few None seen/Few  Comprehensive metabolic panel  Result Value Ref Range   Glucose 96 65 - 99 mg/dL   BUN 11 8 - 27 mg/dL   Creatinine, Ser 0.81 0.57 - 1.00 mg/dL   GFR calc non Af Amer 74 >59 mL/min/1.73   GFR calc Af Amer 85 >59 mL/min/1.73   BUN/Creatinine Ratio 14 11 - 26   Sodium 134 134 - 144 mmol/L   Potassium 3.9 3.5 -  5.2 mmol/L   Chloride 94 (L) 97 - 108 mmol/L   CO2 25 18 - 29 mmol/L   Calcium 9.3 8.7 - 10.3 mg/dL   Total Protein 6.8 6.0 - 8.5 g/dL   Albumin 4.7 3.5 - 4.8 g/dL   Globulin, Total 2.1 1.5 - 4.5 g/dL   Albumin/Globulin Ratio 2.2 1.1 - 2.5   Bilirubin Total 0.5 0.0 - 1.2 mg/dL   Alkaline Phosphatase 64 39 - 117 IU/L   AST 23 0 - 40 IU/L   ALT 15 0 - 32 IU/L  Lipid panel  Result Value Ref Range   Cholesterol, Total 198 100 - 199 mg/dL   Triglycerides 87 0 - 149 mg/dL   HDL 86 >39 mg/dL   VLDL Cholesterol Cal 17 5 - 40 mg/dL   LDL Calculated 95 0 - 99 mg/dL   Chol/HDL Ratio 2.3 0.0 - 4.4 ratio  units  CBC with Differential/Platelet  Result Value Ref Range   WBC 6.8 3.4 - 10.8 x10E3/uL   RBC 3.82 3.77 - 5.28 x10E6/uL   Hemoglobin 11.5 11.1 - 15.9 g/dL   Hematocrit 33.9 (L) 34.0 - 46.6 %   MCV 89 79 - 97 fL   MCH 30.1 26.6 - 33.0 pg   MCHC 33.9 31.5 - 35.7 g/dL   RDW 13.6 12.3 - 15.4 %   Platelets 305 150 - 379 x10E3/uL   Neutrophils 64 %   Lymphs 27 %   Monocytes 8 %   Eos 1 %   Basos 0 %   Neutrophils Absolute 4.4 1.4 - 7.0 x10E3/uL   Lymphocytes Absolute 1.8 0.7 - 3.1 x10E3/uL   Monocytes Absolute 0.5 0.1 - 0.9 x10E3/uL   EOS (ABSOLUTE) 0.1 0.0 - 0.4 x10E3/uL   Basophils Absolute 0.0 0.0 - 0.2 x10E3/uL   Immature Granulocytes 0 %   Immature Grans (Abs) 0.0 0.0 - 0.1 x10E3/uL  TSH  Result Value Ref Range   TSH 1.650 0.450 - 4.500 uIU/mL  Urinalysis, Routine w reflex microscopic (not at [facility])  Result Value Ref Range   Specific Gravity, UA 1.005 1.005 - 1.030   pH, UA 6.0 5.0 - 7.5   Color, UA Yellow Yellow   Appearance Ur Clear Clear   Leukocytes, UA 1+ (A) Negative   Protein, UA Negative Negative/Trace   Glucose, UA Negative Negative   Ketones, UA Negative Negative   RBC, UA Trace (A) Negative   Bilirubin, UA Negative Negative   Urobilinogen, Ur 0.2 0.2 - 1.0 mg/dL   Nitrite, UA Negative Negative   Microscopic Examination See below:       Assessment & Plan:   Problem List Items Addressed This Visit      Cardiovascular and Mediastinum   Essential hypertension    The current medical regimen is effective;  continue present plan and medications.       Relevant Medications   benazepril (LOTENSIN) 40 MG tablet   hydrochlorothiazide (MICROZIDE) 12.5 MG capsule   verapamil (CALAN-SR) 240 MG CR tablet   Other Relevant Orders   Comprehensive metabolic panel (Completed)   Lipid panel (Completed)   CBC with Differential/Platelet (Completed)   TSH (Completed)   Urinalysis, Routine w reflex microscopic (not at [facility]) (Completed)     Nervous and Auditory    Cerebral palsy ([place])    The current medical regimen is effective;  continue present plan and medications. Followed by [facility] neurology  Addendum done 04/17/2015 Due to the patient's cerebral palsy it is a medical necessity that  the patient have a powered wheelchair. She uses his wheelchair daily for activities of daily living. This would not be possible without the powered wheelchair.      Relevant Orders   Comprehensive metabolic panel (Completed)   Lipid panel (Completed)   CBC with Differential/Platelet (Completed)   TSH (Completed)   Urinalysis, Routine w reflex microscopic (not at [facility]) (Completed)    Other Visit Diagnoses    PE (physical exam), annual    -  Primary    Relevant Orders    Comprehensive metabolic panel (Completed)    Lipid panel (Completed)    CBC with Differential/Platelet (Completed)    TSH (Completed)    Urinalysis, Routine w reflex microscopic (not at [facility]) (Completed)    History of breast cancer            Follow up plan: Return in about 6 months (around 07/24/2015), or if symptoms worsen or fail to improve, for Recheck blood pressure medications, BMP.

## 2015-01-24 NOTE — Assessment & Plan Note (Signed)
Resolved

## 2015-01-24 NOTE — Assessment & Plan Note (Addendum)
The current medical regimen is effective;  continue present plan and medications. Followed by [facility] neurology  Addendum done 04/17/2015 Due to the patient's cerebral palsy it is a medical necessity that the patient have a powered wheelchair. She uses his wheelchair daily for activities of daily living. This would not be possible without the powered wheelchair.

## 2015-01-24 NOTE — Assessment & Plan Note (Signed)
The current medical regimen is effective;  continue present plan and medications.  

## 2015-01-25 ENCOUNTER — Encounter: Payer: Self-pay | Admitting: Family Medicine

## 2015-01-25 LAB — LIPID PANEL
CHOLESTEROL TOTAL: 198 mg/dL (ref 100–199)
Chol/HDL Ratio: 2.3 ratio units (ref 0.0–4.4)
HDL: 86 mg/dL (ref >39)
LDL Calculated: 95 mg/dL (ref 0–99)
TRIGLYCERIDES: 87 mg/dL (ref 0–149)
VLDL Cholesterol Cal: 17 mg/dL (ref 5–40)

## 2015-01-25 LAB — COMPREHENSIVE METABOLIC PANEL
A/G RATIO: 2.2 (ref 1.1–2.5)
ALK PHOS: 64 IU/L (ref 39–117)
ALT: 15 IU/L (ref 0–32)
AST: 23 IU/L (ref 0–40)
Albumin: 4.7 g/dL (ref 3.5–4.8)
BILIRUBIN TOTAL: 0.5 mg/dL (ref 0.0–1.2)
BUN/Creatinine Ratio: 14 (ref 11–26)
BUN: 11 mg/dL (ref 8–27)
CHLORIDE: 94 mmol/L — AB (ref 97–108)
CO2: 25 mmol/L (ref 18–29)
Calcium: 9.3 mg/dL (ref 8.7–10.3)
Creatinine, Ser: 0.81 mg/dL (ref 0.57–1.00)
GFR calc Af Amer: 85 mL/min/{1.73_m2} (ref >59)
GFR calc non Af Amer: 74 mL/min/{1.73_m2} (ref >59)
GLOBULIN, TOTAL: 2.1 g/dL (ref 1.5–4.5)
Glucose: 96 mg/dL (ref 65–99)
POTASSIUM: 3.9 mmol/L (ref 3.5–5.2)
Sodium: 134 mmol/L (ref 134–144)
Total Protein: 6.8 g/dL (ref 6.0–8.5)

## 2015-01-25 LAB — CBC WITH DIFFERENTIAL/PLATELET
BASOS: 0 %
Basophils Absolute: 0 10*3/uL (ref 0.0–0.2)
EOS (ABSOLUTE): 0.1 10*3/uL (ref 0.0–0.4)
Eos: 1 %
Hematocrit: 33.9 % — ABNORMAL LOW (ref 34.0–46.6)
Hemoglobin: 11.5 g/dL (ref 11.1–15.9)
IMMATURE GRANS (ABS): 0 10*3/uL (ref 0.0–0.1)
IMMATURE GRANULOCYTES: 0 %
LYMPHS: 27 %
Lymphocytes Absolute: 1.8 10*3/uL (ref 0.7–3.1)
MCH: 30.1 pg (ref 26.6–33.0)
MCHC: 33.9 g/dL (ref 31.5–35.7)
MCV: 89 fL (ref 79–97)
MONOCYTES: 8 %
Monocytes Absolute: 0.5 10*3/uL (ref 0.1–0.9)
NEUTROS ABS: 4.4 10*3/uL (ref 1.4–7.0)
Neutrophils: 64 %
Platelets: 305 10*3/uL (ref 150–379)
RBC: 3.82 x10E6/uL (ref 3.77–5.28)
RDW: 13.6 % (ref 12.3–15.4)
WBC: 6.8 10*3/uL (ref 3.4–10.8)

## 2015-01-25 LAB — TSH: TSH: 1.65 u[IU]/mL (ref 0.450–4.500)

## 2015-03-13 ENCOUNTER — Telehealth: Payer: Self-pay

## 2015-03-13 NOTE — Telephone Encounter (Signed)
Patient letting us know we will be receiving a letter from [employer] to repair her chair.

## 2015-03-19 ENCOUNTER — Other Ambulatory Visit: Payer: Self-pay | Admitting: Family Medicine

## 2015-03-19 NOTE — Progress Notes (Signed)
Documentation The patient has continued and continues to need to use her powered wheelchair. The patient has cerebral palsy and is unable to walk.

## 2015-03-20 ENCOUNTER — Telehealth: Payer: Self-pay

## 2015-03-20 ENCOUNTER — Other Ambulatory Visit: Payer: Self-pay | Admitting: Family Medicine

## 2015-03-20 NOTE — Telephone Encounter (Signed)
Your addendum was put under Orders Only, this needs to be attached to an office note for documentation of her continued use and need of her [employer] chair.

## 2015-04-17 ENCOUNTER — Other Ambulatory Visit: Payer: Self-pay | Admitting: Family Medicine

## 2015-04-17 NOTE — Progress Notes (Signed)
addendum to note 01/24/2015 A powered wheelchair is a medical necessity for this patient. The patient uses this wheelchair daily.

## 2015-04-17 NOTE — Addendum Note (Signed)
Addended byGolden Pop on: 04/17/2015 01:02 PM   Modules accepted: Miquel Dunn

## 2015-04-18 ENCOUNTER — Telehealth: Payer: Self-pay | Admitting: Family Medicine

## 2015-04-18 NOTE — Telephone Encounter (Signed)
Pt called stated she needs to talk to [place] about the paperwork for her electric chair. Please call her ASAP. Thanks.

## 2015-05-03 ENCOUNTER — Ambulatory Visit: Payer: Medicare Other | Admitting: Family Medicine

## 2015-06-28 DIAGNOSIS — R252 Cramp and spasm: Secondary | ICD-10-CM | POA: Diagnosis not present

## 2015-06-28 DIAGNOSIS — G808 Other cerebral palsy: Secondary | ICD-10-CM | POA: Diagnosis not present

## 2015-07-03 DIAGNOSIS — Z853 Personal history of malignant neoplasm of breast: Secondary | ICD-10-CM | POA: Diagnosis not present

## 2015-07-03 DIAGNOSIS — R922 Inconclusive mammogram: Secondary | ICD-10-CM | POA: Diagnosis not present

## 2015-07-03 DIAGNOSIS — Z08 Encounter for follow-up examination after completed treatment for malignant neoplasm: Secondary | ICD-10-CM | POA: Diagnosis not present

## 2015-07-09 ENCOUNTER — Encounter: Payer: Self-pay | Admitting: Family Medicine

## 2015-07-09 ENCOUNTER — Ambulatory Visit (INDEPENDENT_AMBULATORY_CARE_PROVIDER_SITE_OTHER): Payer: Medicare Other | Admitting: Family Medicine

## 2015-07-09 VITALS — BP 137/79 | HR 99 | Temp 97.6°F

## 2015-07-09 DIAGNOSIS — I10 Essential (primary) hypertension: Secondary | ICD-10-CM | POA: Diagnosis not present

## 2015-07-09 DIAGNOSIS — Z113 Encounter for screening for infections with a predominantly sexual mode of transmission: Secondary | ICD-10-CM | POA: Diagnosis not present

## 2015-07-09 DIAGNOSIS — G809 Cerebral palsy, unspecified: Secondary | ICD-10-CM | POA: Diagnosis not present

## 2015-07-09 NOTE — Assessment & Plan Note (Signed)
The current medical regimen is effective;  continue present plan and medications.  

## 2015-07-09 NOTE — Progress Notes (Signed)
BP 137/79 mmHg  Pulse 99  Temp(Src) 97.6 F (36.4 C)  Ht   Wt   SpO2 95%   Subjective:    Patient ID: Suzanne Jordan, female    DOB: 1946-03-24, 70 y.o.   MRN: [id number]  HPI: Suzanne Jordan is a 70 y.o. female  Chief Complaint  Patient presents with  . Hypertension   doing well with blood pressure medications hydrochlorothiazide Benzapril verapamil takes faithfully with no side effects Also wants allergy medication Flonase prescription CP stable   Relevant past medical, surgical, family and social history reviewed and updated as indicated. Interim medical history since our last visit reviewed. Allergies and medications reviewed and updated.  Review of Systems  Respiratory: Negative.   Cardiovascular: Negative.     Per HPI unless specifically indicated above     Objective:    BP 137/79 mmHg  Pulse 99  Temp(Src) 97.6 F (36.4 C)  Ht   Wt   SpO2 95%  Wt Readings from Last 3 Encounters:  01/24/15 107 lb (48.535 kg)  07/10/14 110 lb (49.896 kg)  07/06/13 108 lb (48.988 kg)    Physical Exam  Constitutional: She is oriented to person, place, and time. She appears well-developed and well-nourished. No distress.  HENT:  Head: Normocephalic and atraumatic.  Right Ear: Hearing normal.  Left Ear: Hearing normal.  Nose: Nose normal.  Eyes: Conjunctivae and lids are normal. Right eye exhibits no discharge. Left eye exhibits no discharge. No scleral icterus.  Cardiovascular: Normal rate, regular rhythm and normal heart sounds.   Pulmonary/Chest: Effort normal and breath sounds normal. No respiratory distress.  Musculoskeletal: Normal range of motion.  Neurological: She is alert and oriented to person, place, and time.  Skin: Skin is intact. No rash noted.  Psychiatric: She has a normal mood and affect. Her speech is normal and behavior is normal. Judgment and thought content normal. Cognition and memory are normal.    Results for orders placed or performed in  visit on 01/24/15  Microscopic Examination  Result Value Ref Range   WBC, UA 0-5 0 -  5 /hpf   RBC, UA 0-2 0 -  2 /hpf   Epithelial Cells (non renal) 0-10 0 - 10 /hpf   Renal Epithel, UA None seen None seen /hpf   Bacteria, UA Few None seen/Few  Comprehensive metabolic panel  Result Value Ref Range   Glucose 96 65 - 99 mg/dL   BUN 11 8 - 27 mg/dL   Creatinine, Ser 0.81 0.57 - 1.00 mg/dL   GFR calc non Af Amer 74 >59 mL/min/1.73   GFR calc Af Amer 85 >59 mL/min/1.73   BUN/Creatinine Ratio 14 11 - 26   Sodium 134 134 - 144 mmol/L   Potassium 3.9 3.5 - 5.2 mmol/L   Chloride 94 (L) 97 - 108 mmol/L   CO2 25 18 - 29 mmol/L   Calcium 9.3 8.7 - 10.3 mg/dL   Total Protein 6.8 6.0 - 8.5 g/dL   Albumin 4.7 3.5 - 4.8 g/dL   Globulin, Total 2.1 1.5 - 4.5 g/dL   Albumin/Globulin Ratio 2.2 1.1 - 2.5   Bilirubin Total 0.5 0.0 - 1.2 mg/dL   Alkaline Phosphatase 64 39 - 117 IU/L   AST 23 0 - 40 IU/L   ALT 15 0 - 32 IU/L  Lipid panel  Result Value Ref Range   Cholesterol, Total 198 100 - 199 mg/dL   Triglycerides 87 0 - 149 mg/dL   HDL  86 >39 mg/dL   VLDL Cholesterol Cal 17 5 - 40 mg/dL   LDL Calculated 95 0 - 99 mg/dL   Chol/HDL Ratio 2.3 0.0 - 4.4 ratio units  CBC with Differential/Platelet  Result Value Ref Range   WBC 6.8 3.4 - 10.8 x10E3/uL   RBC 3.82 3.77 - 5.28 x10E6/uL   Hemoglobin 11.5 11.1 - 15.9 g/dL   Hematocrit 33.9 (L) 34.0 - 46.6 %   MCV 89 79 - 97 fL   MCH 30.1 26.6 - 33.0 pg   MCHC 33.9 31.5 - 35.7 g/dL   RDW 13.6 12.3 - 15.4 %   Platelets 305 150 - 379 x10E3/uL   Neutrophils 64 %   Lymphs 27 %   Monocytes 8 %   Eos 1 %   Basos 0 %   Neutrophils Absolute 4.4 1.4 - 7.0 x10E3/uL   Lymphocytes Absolute 1.8 0.7 - 3.1 x10E3/uL   Monocytes Absolute 0.5 0.1 - 0.9 x10E3/uL   EOS (ABSOLUTE) 0.1 0.0 - 0.4 x10E3/uL   Basophils Absolute 0.0 0.0 - 0.2 x10E3/uL   Immature Granulocytes 0 %   Immature Grans (Abs) 0.0 0.0 - 0.1 x10E3/uL  TSH  Result Value Ref Range   TSH  1.650 0.450 - 4.500 uIU/mL  Urinalysis, Routine w reflex microscopic (not at [facility])  Result Value Ref Range   Specific Gravity, UA 1.005 1.005 - 1.030   pH, UA 6.0 5.0 - 7.5   Color, UA Yellow Yellow   Appearance Ur Clear Clear   Leukocytes, UA 1+ (A) Negative   Protein, UA Negative Negative/Trace   Glucose, UA Negative Negative   Ketones, UA Negative Negative   RBC, UA Trace (A) Negative   Bilirubin, UA Negative Negative   Urobilinogen, Ur 0.2 0.2 - 1.0 mg/dL   Nitrite, UA Negative Negative   Microscopic Examination See below:       Assessment & Plan:   Problem List Items Addressed This Visit      Cardiovascular and Mediastinum   Essential hypertension - Primary    The current medical regimen is effective;  continue present plan and medications.       Relevant Orders   Basic metabolic panel     Nervous and Auditory   Cerebral palsy ([place])    Followed by neurology and stable       Other Visit Diagnoses    Routine screening for STI (sexually transmitted infection)        Relevant Orders    Hepatitis C Antibody        Follow up plan: Return in about 6 months (around 01/09/2016) for Physical Exam.

## 2015-07-09 NOTE — Assessment & Plan Note (Signed)
Followed by neurology and stable 

## 2015-07-10 ENCOUNTER — Telehealth: Payer: Self-pay | Admitting: Family Medicine

## 2015-07-10 ENCOUNTER — Encounter: Payer: Self-pay | Admitting: Family Medicine

## 2015-07-10 LAB — BASIC METABOLIC PANEL
BUN/Creatinine Ratio: 18 (ref 11–26)
BUN: 14 mg/dL (ref 8–27)
CALCIUM: 10.1 mg/dL (ref 8.7–10.3)
CHLORIDE: 98 mmol/L (ref 96–106)
CO2: 26 mmol/L (ref 18–29)
Creatinine, Ser: 0.76 mg/dL (ref 0.57–1.00)
GFR calc non Af Amer: 80 mL/min/{1.73_m2} (ref >59)
GFR, EST AFRICAN AMERICAN: 92 mL/min/{1.73_m2} (ref >59)
GLUCOSE: 93 mg/dL (ref 65–99)
POTASSIUM: 3.7 mmol/L (ref 3.5–5.2)
Sodium: 141 mmol/L (ref 134–144)

## 2015-07-10 LAB — HEPATITIS C ANTIBODY: HEP C VIRUS AB: 0.1 {s_co_ratio} (ref 0.0–0.9)

## 2015-07-10 MED ORDER — FLUTICASONE PROPIONATE 50 MCG/ACT NA SUSP: 2.0000 | Freq: Every day | NASAL | Status: DC

## 2015-07-10 NOTE — Telephone Encounter (Signed)
Pt would like to have flonase sent to [facility].

## 2015-07-12 ENCOUNTER — Ambulatory Visit: Payer: Medicare Other | Admitting: General Surgery

## 2015-07-17 ENCOUNTER — Ambulatory Visit (INDEPENDENT_AMBULATORY_CARE_PROVIDER_SITE_OTHER): Payer: Medicare Other | Admitting: General Surgery

## 2015-07-17 ENCOUNTER — Encounter: Payer: Self-pay | Admitting: General Surgery

## 2015-07-17 ENCOUNTER — Other Ambulatory Visit: Payer: Self-pay

## 2015-07-17 VITALS — BP 124/72 | HR 72 | Resp 14 | Ht 60.0 in | Wt 108.0 lb

## 2015-07-17 DIAGNOSIS — Z853 Personal history of malignant neoplasm of breast: Secondary | ICD-10-CM | POA: Diagnosis not present

## 2015-07-17 DIAGNOSIS — N6320 Unspecified lump in the left breast, unspecified quadrant: Secondary | ICD-10-CM

## 2015-07-17 DIAGNOSIS — N63 Unspecified lump in breast: Secondary | ICD-10-CM | POA: Diagnosis not present

## 2015-07-17 NOTE — Progress Notes (Addendum)
Patient ID: Suzanne Jordan, female   DOB: August 16, 1945, 70 y.o.   MRN: [id number]  Chief Complaint  Patient presents with  . Follow-up    mammogram    HPI. Suzanne Jordan is a 70 y.o. female who presents for a breast evaluation. The most recent mammogram was done on 07/03/15. The patient reports no breast discomfort at this time. No change on her own self-exam. Patient does perform regular self breast checks and gets regular mammograms done.    I person reviewed the patient's history.  HPI  Past Medical History  Diagnosis Date  . Cerebral palsy ([place])     at birth  . 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     Past Surgical History  Procedure Laterality Date  . Breast surgery Right 2010    wide excision with sn biopsy  . Fracture surgery Right     right leg, as a child  . Abdominal hysterectomy  1970's  . Tubal ligation    . Cataract extraction      Family History  Problem Relation Age of Onset  . Stroke Father     passed Jan 2014 age 60  . Cancer Other     colon,breast,ovarian cancers, relationships not listed    Social History Social History  Substance Use Topics  . Smoking status: Never Smoker   . Smokeless tobacco: Never Used  . Alcohol Use: No    No Known Allergies  Current Outpatient Prescriptions  Medication Sig Dispense Refill  . aspirin 81 MG tablet Take 81 mg by mouth daily.    . benazepril (LOTENSIN) 40 MG tablet Take 1 tablet (40 mg total) by mouth daily. 30 tablet 12  . diazepam (VALIUM) 10 MG tablet Take 1 tablet (10 mg total) by mouth every 12 (twelve) hours as needed for anxiety. 60 tablet 1  . fluticasone (FLONASE) 50 MCG/ACT nasal spray Place 2 sprays into both nostrils daily. 16 g 12  . hydrochlorothiazide (MICROZIDE) 12.5 MG capsule Take 1 capsule (12.5 mg total) by mouth daily. 30 capsule 12  . Multiple Vitamin (MULTIVITAMIN) capsule Take 1 capsule by mouth daily.    . verapamil (CALAN-SR) 240 MG CR tablet  Take 1 tablet (240 mg total) by mouth daily. 30 tablet 12   No current facility-administered medications for this visit.    Review of Systems Review of Systems  Constitutional: Negative.   Respiratory: Negative.   Cardiovascular: Negative.     Blood pressure 124/72, pulse 72, resp. rate 14, height 5' (1.524 m), weight 108 lb (48.988 kg).  Physical Exam Physical Exam  Constitutional: She is oriented to person, place, and time. She appears well-developed and well-nourished.  Eyes: Conjunctivae are normal.  Neck: Neck supple.  Cardiovascular: Normal rate, regular rhythm and normal heart sounds.   Pulmonary/Chest: Effort normal and breath sounds normal. Right breast exhibits no inverted nipple, no mass, no nipple discharge, no skin change and no tenderness. Left breast exhibits no inverted nipple, no mass, no nipple discharge, no skin change and no tenderness.    Lymphadenopathy:    She has no cervical adenopathy.  Neurological: She is oriented to person, place, and time.  Skin: Skin is warm and dry.    Data Reviewed Bilateral mammograms dated 07/03/2015 completed at [facility] were reviewed. It is exam secondary to patient positioning a wheelchair. No interval change. BI-RADS-2.  There is a well-defined 6 mm density in the upper-outer quadrant of  the left breast/axillary tail.  Ultrasound examination of this area was completed. A well-defined 0.56 x 0.62 x 0.64 simple axillary lymph nodes identified that correlates with the mammographic finding. Normal echo architecture. Evaluation of the breast from the 12 to 3:00 position showed normal breast parenchyma with mild prominence done age. No cystic or solid lesions or evidence of architectural distortion. BI-RADS-2.  Assessment    Benign breast exam, modest volume loss in the left breast post wide excision/radiation, asymptomatic.    Plan      Patient will be asked to return to the office in one year with a bilateral  diagnotic mammogram. PCP:  [place] This information has been scribed by Gaspar Cola CMA.    Robert Bellow 07/17/2015, 9:18 PM

## 2015-07-17 NOTE — Patient Instructions (Signed)
Patient will be asked to return to the office in one year with a bilateral diagnotic  mammogram. 

## 2015-07-20 ENCOUNTER — Encounter: Payer: Self-pay | Admitting: General Surgery

## 2015-11-28 DIAGNOSIS — Z961 Presence of intraocular lens: Secondary | ICD-10-CM | POA: Diagnosis not present

## 2015-12-03 DIAGNOSIS — G809 Cerebral palsy, unspecified: Secondary | ICD-10-CM | POA: Diagnosis not present

## 2015-12-03 DIAGNOSIS — R252 Cramp and spasm: Secondary | ICD-10-CM | POA: Diagnosis not present

## 2016-01-10 DIAGNOSIS — Z23 Encounter for immunization: Secondary | ICD-10-CM | POA: Diagnosis not present

## 2016-01-31 ENCOUNTER — Other Ambulatory Visit: Payer: Self-pay | Admitting: Family Medicine

## 2016-01-31 DIAGNOSIS — I10 Essential (primary) hypertension: Secondary | ICD-10-CM

## 2016-02-01 ENCOUNTER — Other Ambulatory Visit: Payer: Self-pay | Admitting: Family Medicine

## 2016-02-01 DIAGNOSIS — I10 Essential (primary) hypertension: Secondary | ICD-10-CM

## 2016-02-11 ENCOUNTER — Telehealth: Payer: Self-pay | Admitting: Family Medicine

## 2016-02-11 NOTE — Telephone Encounter (Signed)
Pt would like to get a call back regarding getting a raised toilet seat.

## 2016-03-05 ENCOUNTER — Encounter (INDEPENDENT_AMBULATORY_CARE_PROVIDER_SITE_OTHER): Payer: Self-pay

## 2016-03-19 ENCOUNTER — Encounter: Payer: Self-pay | Admitting: Family Medicine

## 2016-03-19 ENCOUNTER — Ambulatory Visit (INDEPENDENT_AMBULATORY_CARE_PROVIDER_SITE_OTHER): Payer: Medicare Other | Admitting: Family Medicine

## 2016-03-19 VITALS — BP 133/77 | HR 109 | Temp 97.4°F

## 2016-03-19 DIAGNOSIS — C50411 Malignant neoplasm of upper-outer quadrant of right female breast: Secondary | ICD-10-CM

## 2016-03-19 DIAGNOSIS — I10 Essential (primary) hypertension: Secondary | ICD-10-CM

## 2016-03-19 DIAGNOSIS — G809 Cerebral palsy, unspecified: Secondary | ICD-10-CM

## 2016-03-19 MED ORDER — HYDROCHLOROTHIAZIDE 12.5 MG PO CAPS: 12.5000 mg | ORAL_CAPSULE | Freq: Every day | ORAL | 12 refills | Status: DC

## 2016-03-19 MED ORDER — VERAPAMIL HCL ER 240 MG PO TBCR: 240.0000 mg | EXTENDED_RELEASE_TABLET | Freq: Every day | ORAL | 12 refills | Status: DC

## 2016-03-19 MED ORDER — FLUTICASONE PROPIONATE 50 MCG/ACT NA SUSP: 2.0000 | Freq: Every day | NASAL | 12 refills | Status: DC

## 2016-03-19 NOTE — Addendum Note (Signed)
Addended byWende Mott J on: 03/19/2016 01:43 PM   Modules accepted: Orders

## 2016-03-19 NOTE — Assessment & Plan Note (Signed)
The current medical regimen is effective;  continue present plan and medications.  

## 2016-03-19 NOTE — Assessment & Plan Note (Signed)
Stable doing well followed by surgery

## 2016-03-19 NOTE — Assessment & Plan Note (Addendum)
The current medical regimen is effective;  continue present plan and medications. Patient also raised toilet seat is about worn-out needs a new one gave order for this toilet seat. This is the patient's face-to-face visit. Patient is in wheelchair has marked spasticity and unable to use regular toilet.

## 2016-03-19 NOTE — Progress Notes (Signed)
BP 133/77   Pulse (!) 109   Temp 97.4 F (36.3 C)   PF 98 L/min    Subjective:    Patient ID: Suzanne Jordan, female    DOB: Oct 27, 1945, 70 y.o.   MRN: [id number]  HPI: Suzanne Jordan is a 70 y.o. female  Chief Complaint  Patient presents with  . Annual Exam  AWV metrics met Patient follow-up blood pressures been doing well no complaints from medications taken faithfully without side effects. MS stable followed with neurology and no special problems with spasticity. Taking Valium without issues. Relevant past medical, surgical, family and social history reviewed and updated as indicated. Interim medical history since our last visit reviewed. Allergies and medications reviewed and updated.  Review of Systems  Constitutional: Negative.   HENT: Negative.   Eyes: Negative.   Respiratory: Negative.   Cardiovascular: Negative.   Gastrointestinal: Negative.   Endocrine: Negative.   Genitourinary: Negative.   Musculoskeletal: Negative.   Skin: Negative.   Allergic/Immunologic: Negative.   Neurological: Negative.   Hematological: Negative.   Psychiatric/Behavioral: Negative.     Per HPI unless specifically indicated above     Objective:    BP 133/77   Pulse (!) 109   Temp 97.4 F (36.3 C)   PF 98 L/min   Wt Readings from Last 3 Encounters:  07/17/15 108 lb (49 kg)  01/24/15 107 lb (48.5 kg)  07/10/14 110 lb (49.9 kg)    Physical Exam  Constitutional: She is oriented to person, place, and time. She appears well-developed and well-nourished.  HENT:  Head: Normocephalic and atraumatic.  Right Ear: External ear normal.  Left Ear: External ear normal.  Nose: Nose normal.  Mouth/Throat: Oropharynx is clear and moist.  Eyes: Conjunctivae and EOM are normal. Pupils are equal, round, and reactive to light.  Neck: Normal range of motion. Neck supple. Carotid bruit is not present.  Cardiovascular: Normal rate, regular rhythm and normal heart sounds.   No murmur  heard. Pulmonary/Chest: Effort normal and breath sounds normal. She exhibits no mass. Right breast exhibits no mass, no skin change and no tenderness. Left breast exhibits no mass, no skin change and no tenderness. Breasts are symmetrical.  Abdominal: Soft. Bowel sounds are normal. There is no hepatosplenomegaly.  Musculoskeletal: Normal range of motion.  Neurological: She is alert and oriented to person, place, and time.  Skin: No rash noted.  Psychiatric: She has a normal mood and affect. Her behavior is normal. Judgment and thought content normal.    Results for orders placed or performed in visit on 56/81/27  Basic metabolic panel  Result Value Ref Range   Glucose 93 65 - 99 mg/dL   BUN 14 8 - 27 mg/dL   Creatinine, Ser 0.76 0.57 - 1.00 mg/dL   GFR calc non Af Amer 80 >59 mL/min/1.73   GFR calc Af Amer 92 >59 mL/min/1.73   BUN/Creatinine Ratio 18 11 - 26   Sodium 141 134 - 144 mmol/L   Potassium 3.7 3.5 - 5.2 mmol/L   Chloride 98 96 - 106 mmol/L   CO2 26 18 - 29 mmol/L   Calcium 10.1 8.7 - 10.3 mg/dL  Hepatitis C Antibody  Result Value Ref Range   Hep C Virus Ab 0.1 0.0 - 0.9 s/co ratio      Assessment & Plan:   Problem List Items Addressed This Visit      Cardiovascular and Mediastinum   Essential hypertension - Primary    The  current medical regimen is effective;  continue present plan and medications.       Relevant Medications   hydrochlorothiazide (MICROZIDE) 12.5 MG capsule   verapamil (CALAN-SR) 240 MG CR tablet   Other Relevant Orders   Comprehensive metabolic panel   CBC with Differential/Platelet   Lipid Profile   TSH   Urinalysis, Routine w reflex microscopic (not at [facility])     Nervous and Auditory   Cerebral palsy [facility])    The current medical regimen is effective;  continue present plan and medications. Patient also raised toilet seat is about worn-out needs a new one gave order for this toilet seat. This is the patient's face-to-face visit. Patient  is in wheelchair has marked spasticity and unable to use regular toilet.         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

## 2016-03-20 LAB — COMPREHENSIVE METABOLIC PANEL
ALK PHOS: 86 IU/L (ref 39–117)
ALT: 22 IU/L (ref 0–32)
AST: 26 IU/L (ref 0–40)
Albumin/Globulin Ratio: 1.9 (ref 1.2–2.2)
Albumin: 4.5 g/dL (ref 3.5–4.8)
BILIRUBIN TOTAL: 0.4 mg/dL (ref 0.0–1.2)
BUN/Creatinine Ratio: 15 (ref 12–28)
BUN: 12 mg/dL (ref 8–27)
CHLORIDE: 95 mmol/L — AB (ref 96–106)
CO2: 25 mmol/L (ref 18–29)
Calcium: 9.5 mg/dL (ref 8.7–10.3)
Creatinine, Ser: 0.82 mg/dL (ref 0.57–1.00)
GFR calc Af Amer: 83 mL/min/{1.73_m2} (ref >59)
GFR calc non Af Amer: 72 mL/min/{1.73_m2} (ref >59)
GLUCOSE: 97 mg/dL (ref 65–99)
Globulin, Total: 2.4 g/dL (ref 1.5–4.5)
Potassium: 4 mmol/L (ref 3.5–5.2)
Sodium: 137 mmol/L (ref 134–144)
TOTAL PROTEIN: 6.9 g/dL (ref 6.0–8.5)

## 2016-03-20 LAB — CBC WITH DIFFERENTIAL/PLATELET
BASOS ABS: 0 10*3/uL (ref 0.0–0.2)
Basos: 0 %
EOS (ABSOLUTE): 0 10*3/uL (ref 0.0–0.4)
Eos: 0 %
HEMOGLOBIN: 12.1 g/dL (ref 11.1–15.9)
Hematocrit: 35.3 % (ref 34.0–46.6)
IMMATURE GRANS (ABS): 0 10*3/uL (ref 0.0–0.1)
Immature Granulocytes: 0 %
LYMPHS: 27 %
Lymphocytes Absolute: 2.1 10*3/uL (ref 0.7–3.1)
MCH: 30.9 pg (ref 26.6–33.0)
MCHC: 34.3 g/dL (ref 31.5–35.7)
MCV: 90 fL (ref 79–97)
MONOCYTES: 5 %
Monocytes Absolute: 0.4 10*3/uL (ref 0.1–0.9)
NEUTROS PCT: 68 %
Neutrophils Absolute: 5 10*3/uL (ref 1.4–7.0)
PLATELETS: 310 10*3/uL (ref 150–379)
RBC: 3.92 x10E6/uL (ref 3.77–5.28)
RDW: 13.5 % (ref 12.3–15.4)
WBC: 7.5 10*3/uL (ref 3.4–10.8)

## 2016-03-20 LAB — LIPID PANEL
CHOL/HDL RATIO: 2 ratio (ref 0.0–4.4)
Cholesterol, Total: 186 mg/dL (ref 100–199)
HDL: 92 mg/dL (ref >39)
LDL CALC: 77 mg/dL (ref 0–99)
Triglycerides: 87 mg/dL (ref 0–149)
VLDL Cholesterol Cal: 17 mg/dL (ref 5–40)

## 2016-03-20 LAB — TSH: TSH: 1.65 u[IU]/mL (ref 0.450–4.500)

## 2016-03-21 ENCOUNTER — Other Ambulatory Visit: Payer: Medicare Other

## 2016-03-21 DIAGNOSIS — I10 Essential (primary) hypertension: Secondary | ICD-10-CM | POA: Diagnosis not present

## 2016-03-21 LAB — URINALYSIS, ROUTINE W REFLEX MICROSCOPIC
Bilirubin, UA: NEGATIVE
Glucose, UA: NEGATIVE
Ketones, UA: NEGATIVE
LEUKOCYTES UA: NEGATIVE
NITRITE UA: NEGATIVE
PH UA: 5.5 (ref 5.0–7.5)
PROTEIN UA: NEGATIVE
RBC UA: NEGATIVE
SPEC GRAV UA: 1.015 (ref 1.005–1.030)
Urobilinogen, Ur: 0.2 mg/dL (ref 0.2–1.0)

## 2016-03-24 ENCOUNTER — Encounter: Payer: Self-pay | Admitting: Family Medicine

## 2016-05-23 ENCOUNTER — Other Ambulatory Visit: Payer: Self-pay

## 2016-05-23 DIAGNOSIS — Z853 Personal history of malignant neoplasm of breast: Secondary | ICD-10-CM

## 2016-06-09 DIAGNOSIS — G809 Cerebral palsy, unspecified: Secondary | ICD-10-CM | POA: Diagnosis not present

## 2016-06-09 DIAGNOSIS — R252 Cramp and spasm: Secondary | ICD-10-CM | POA: Diagnosis not present

## 2016-06-27 ENCOUNTER — Other Ambulatory Visit: Payer: Self-pay | Admitting: Family Medicine

## 2016-06-27 DIAGNOSIS — I10 Essential (primary) hypertension: Secondary | ICD-10-CM

## 2016-07-08 ENCOUNTER — Telehealth: Payer: Self-pay | Admitting: Family Medicine

## 2016-07-08 NOTE — Telephone Encounter (Signed)
Is there any OTC's pt can use since she cannot come in? Please advise.

## 2016-07-08 NOTE — Telephone Encounter (Signed)
She can take coracidin HBP, mucinex, and nasal saline, if she is not feeling better, let us know
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
## 2016-07-08 NOTE — Telephone Encounter (Signed)
Message relayed to patient. Verbalized understanding and denied questions.   

## 2016-07-11 ENCOUNTER — Other Ambulatory Visit: Payer: Self-pay

## 2016-07-17 ENCOUNTER — Ambulatory Visit: Payer: Medicare Other | Admitting: General Surgery

## 2016-08-05 ENCOUNTER — Ambulatory Visit
Admission: RE | Admit: 2016-08-05 | Discharge: 2016-08-05 | Disposition: A | Discharge status: Home / Self Care | Payer: Medicare Other | Source: Ambulatory Visit | Attending: General Surgery | Admitting: General Surgery

## 2016-08-05 DIAGNOSIS — Z853 Personal history of malignant neoplasm of breast: Secondary | ICD-10-CM | POA: Diagnosis not present

## 2016-08-05 DIAGNOSIS — R922 Inconclusive mammogram: Secondary | ICD-10-CM | POA: Diagnosis not present

## 2016-08-12 ENCOUNTER — Encounter: Payer: Self-pay | Admitting: *Deleted

## 2016-08-18 ENCOUNTER — Ambulatory Visit: Payer: Medicare Other | Admitting: General Surgery

## 2016-09-03 ENCOUNTER — Ambulatory Visit (INDEPENDENT_AMBULATORY_CARE_PROVIDER_SITE_OTHER): Payer: Medicare Other | Admitting: General Surgery

## 2016-09-03 ENCOUNTER — Encounter: Payer: Self-pay | Admitting: General Surgery

## 2016-09-03 VITALS — BP 108/68 | HR 66 | Resp 14 | Ht 62.0 in | Wt 108.0 lb

## 2016-09-03 DIAGNOSIS — Z853 Personal history of malignant neoplasm of breast: Secondary | ICD-10-CM | POA: Diagnosis not present

## 2016-09-03 NOTE — Progress Notes (Signed)
Patient ID: Suzanne Jordan, female   DOB: 12/10/1945, 71 y.o.   MRN: [id number]  Chief Complaint  Patient presents with  . Follow-up    mammogram    HPI Suzanne Jordan is a 71 y.o. female who presents for a breast evaluation. The most recent mammogram was done on 07/11/2016.  Patient does perform regular self breast checks and gets regular mammograms done.    Examination was completed in the patient's wheelchair based on her mobility issues. Marland KitchenHPI  Past Medical History:  Diagnosis Date  . Cancer ([place]) 2010   T1c, N0, ER/ PR +. 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   . Cerebral palsy ([place])    at birth    Past Surgical History:  Procedure Laterality Date  . ABDOMINAL HYSTERECTOMY  1970's  . BREAST LUMPECTOMY Right 2010  . BREAST SURGERY Right 2010   wide excision with sn biopsy  . CATARACT EXTRACTION    . FRACTURE SURGERY Right    right leg, as a child  . TUBAL LIGATION      Family History  Problem Relation Age of Onset  . Stroke Father     passed Jan 2014 age 34  . Cancer Other     colon,breast,ovarian cancers, relationships not listed  . Breast cancer Neg Hx     Social History Social History  Substance Use Topics  . Smoking status: Never Smoker  . Smokeless tobacco: Never Used  . Alcohol use No    No Known Allergies  Current Outpatient Prescriptions  Medication Sig Dispense Refill  . aspirin 81 MG tablet Take 81 mg by mouth daily.    . benazepril (LOTENSIN) 40 MG tablet TAKE ONE (1) TABLET BY MOUTH EVERY DAY 30 tablet 2  . benazepril (LOTENSIN) 40 MG tablet TAKE ONE (1) TABLET BY MOUTH EVERY DAY 30 tablet 3  . diazepam (VALIUM) 10 MG tablet Take 1 tablet (10 mg total) by mouth every 12 (twelve) hours as needed for anxiety. 60 tablet 1  . fluticasone (FLONASE) 50 MCG/ACT nasal spray Place 2 sprays into both nostrils daily. 16 g 12  . hydrochlorothiazide (MICROZIDE) 12.5 MG capsule Take 1 capsule (12.5 mg total) by mouth daily. 30 capsule 12  . Multiple Vitamin  (MULTIVITAMIN) capsule Take 1 capsule by mouth daily.    . verapamil (CALAN-SR) 240 MG CR tablet Take 1 tablet (240 mg total) by mouth daily. 30 tablet 12   No current facility-administered medications for this visit.     Review of Systems Review of Systems  Constitutional: Negative.   Respiratory: Negative.   Cardiovascular: Negative.     Blood pressure 108/68, pulse 66, resp. rate 14, height 5\' 2"  (1.575 m), weight 108 lb (49 kg).  Physical Exam Physical Exam  Constitutional: She is oriented to person, place, and time. She appears well-developed and well-nourished.  Eyes: Conjunctivae are normal. No scleral icterus.  Neck: Neck supple.  Cardiovascular: Normal rate, regular rhythm and normal heart sounds.   Pulmonary/Chest: Effort normal and breath sounds normal. Right breast exhibits no inverted nipple, no mass, no nipple discharge, no skin change and no tenderness. Left breast exhibits no inverted nipple, no mass, no nipple discharge, no skin change and no tenderness.    Lymphadenopathy:    She has no cervical adenopathy.    She has no axillary adenopathy.  Neurological: She is alert and oriented to person, place, and time.  Skin: Skin is warm and dry.    Data Reviewed 08/05/2016 bilateral diagnostic mammograms  were reviewed. No interval change. Significant postsurgical changes on the right. BI-RADS-2. Screening mammograms recommended for 2019.  Assessment    No evidence of recurrent breast cancer.      Plan         The patient has been asked to return to the office in one year with a bilateral screening mammogram.  Discussed colonoscopy with patient, she is not ready at this time. Discussed Cologuard testing, not presently interested.  HPI, Physical Exam, Assessment and Plan have been scribed under the direction and in the presence of Hervey Ard, MD.  Gaspar Cola, CMA  I have completed the exam and reviewed the above documentation for accuracy and  completeness.  I agree with the above.  Haematologist has been used and any errors in dictation or transcription are unintentional.  Hervey Ard, M.D., F.A.C.S.  Robert Bellow 09/03/2016, 9:21 PM

## 2016-09-03 NOTE — Patient Instructions (Signed)
The patient has been asked to return to the office in one year with a bilateral screening mammogram. 

## 2016-09-17 ENCOUNTER — Encounter: Payer: Self-pay | Admitting: Family Medicine

## 2016-09-17 ENCOUNTER — Ambulatory Visit (INDEPENDENT_AMBULATORY_CARE_PROVIDER_SITE_OTHER): Payer: Medicare Other | Admitting: Family Medicine

## 2016-09-17 VITALS — BP 124/76 | HR 103 | Ht 62.0 in | Wt 108.0 lb

## 2016-09-17 DIAGNOSIS — G809 Cerebral palsy, unspecified: Secondary | ICD-10-CM | POA: Diagnosis not present

## 2016-09-17 DIAGNOSIS — I10 Essential (primary) hypertension: Secondary | ICD-10-CM

## 2016-09-17 MED ORDER — BENAZEPRIL HCL 40 MG PO TABS: 40.0000 mg | ORAL_TABLET | Freq: Every day | ORAL | 7 refills | Status: DC

## 2016-09-17 NOTE — Assessment & Plan Note (Signed)
The current medical regimen is effective;  continue present plan and medications.  

## 2016-09-17 NOTE — Progress Notes (Signed)
   BP 124/76   Pulse (!) 103   Ht 5\' 2"  (1.575 m)   Wt 108 lb (49 kg)   SpO2 98%   BMI 19.75 kg/m    Subjective:    Patient ID: Suzanne Jordan, female    DOB: 1946-02-05, 71 y.o.   MRN: [id number]  HPI: Suzanne Jordan is a 71 y.o. female  Chief Complaint  Patient presents with  . Follow-up   Patient follow-up hypertension doing well no complaints from medications taken faithfully. No symptoms from blood pressure shortness of breath. CP stable taking Valium 10 mg twice a day which helps control spasming.  Relevant past medical, surgical, family and social history reviewed and updated as indicated. Interim medical history since our last visit reviewed. Allergies and medications reviewed and updated.  Review of Systems  Constitutional: Negative.   Respiratory: Negative.   Cardiovascular: Negative.     Per HPI unless specifically indicated above     Objective:    BP 124/76   Pulse (!) 103   Ht 5\' 2"  (1.575 m)   Wt 108 lb (49 kg)   SpO2 98%   BMI 19.75 kg/m   Wt Readings from Last 3 Encounters:  09/17/16 108 lb (49 kg)  09/03/16 108 lb (49 kg)  07/17/15 108 lb (49 kg)    Physical Exam  Constitutional: She is oriented to person, place, and time. She appears well-developed and well-nourished.  HENT:  Head: Normocephalic and atraumatic.  Eyes: Conjunctivae and EOM are normal.  Neck: Normal range of motion.  Cardiovascular: Normal rate, regular rhythm and normal heart sounds.   Pulmonary/Chest: Effort normal and breath sounds normal.  Musculoskeletal: Normal range of motion.  Neurological: She is alert and oriented to person, place, and time.  Skin: No erythema.  Psychiatric: She has a normal mood and affect. Her behavior is normal. Judgment and thought content normal.    Results for orders placed or performed in visit on 03/21/16  Urinalysis, Routine w reflex microscopic (not at [facility])  Result Value Ref Range   Specific Gravity, UA 1.015 1.005 - 1.030   pH, UA 5.5 5.0 - 7.5   Color, UA Yellow Yellow   Appearance Ur Clear Clear   Leukocytes, UA Negative Negative   Protein, UA Negative Negative/Trace   Glucose, UA Negative Negative   Ketones, UA Negative Negative   RBC, UA Negative Negative   Bilirubin, UA Negative Negative   Urobilinogen, Ur 0.2 0.2 - 1.0 mg/dL   Nitrite, UA Negative Negative      Assessment & Plan:   Problem List Items Addressed This Visit      Cardiovascular and Mediastinum   Essential hypertension - Primary   Relevant Medications   verapamil (CALAN-SR) 240 MG CR tablet   benazepril (LOTENSIN) 40 MG tablet   Other Relevant Orders   Basic metabolic panel     Nervous and Auditory   Cerebral palsy ([facility])    The current medical regimen is effective;  continue present plan and medications.           Follow up plan: Return in about 6 months (around 03/20/2017).

## 2016-09-18 ENCOUNTER — Encounter: Payer: Self-pay | Admitting: Family Medicine

## 2016-09-18 LAB — BASIC METABOLIC PANEL
BUN / CREAT RATIO: 13 (ref 12–28)
BUN: 11 mg/dL (ref 8–27)
CALCIUM: 10.4 mg/dL — AB (ref 8.7–10.3)
CO2: 26 mmol/L (ref 18–29)
CREATININE: 0.88 mg/dL (ref 0.57–1.00)
Chloride: 102 mmol/L (ref 96–106)
GFR calc Af Amer: 76 mL/min/{1.73_m2} (ref >59)
GFR calc non Af Amer: 66 mL/min/{1.73_m2} (ref >59)
Glucose: 92 mg/dL (ref 65–99)
Potassium: 4.4 mmol/L (ref 3.5–5.2)
Sodium: 144 mmol/L (ref 134–144)

## 2016-10-15 ENCOUNTER — Telehealth: Payer: Self-pay | Admitting: Family Medicine

## 2016-10-15 NOTE — Telephone Encounter (Signed)
Noted will complete once fax is received.

## 2016-10-15 NOTE — Telephone Encounter (Signed)
Home care would like for provider to fill out PCS form (ICD-10) personal care service and faxed back. Home care facility stated they will fax sheet for provider. Home care facility is aware that message will be sent and that provider is out of office.   House Fax: [phone]  Please Advise.  Thank you

## 2016-12-19 DIAGNOSIS — G808 Other cerebral palsy: Secondary | ICD-10-CM | POA: Diagnosis not present

## 2016-12-19 DIAGNOSIS — R252 Cramp and spasm: Secondary | ICD-10-CM | POA: Diagnosis not present

## 2017-01-13 DIAGNOSIS — Z23 Encounter for immunization: Secondary | ICD-10-CM | POA: Diagnosis not present

## 2017-03-16 ENCOUNTER — Telehealth: Payer: Self-pay | Admitting: Family Medicine

## 2017-03-16 NOTE — Telephone Encounter (Signed)
Called to let patient know that would be okay.

## 2017-03-16 NOTE — Telephone Encounter (Signed)
Copied from [place] [id number]. Topic: Quick Communication - See Telephone Encounter >> Mar 16, 2017 11:32 AM Burnis Medin, NT wrote: CRM for notification. Pt. Called in and wanted to know if she was going to have to do a urine specimen when she did her labs before her physical. Pt wanted to know if her CNA could come get a urine specimen cup so she can give her sample from home. Pt said she has cerebral palsy. Pt would like a call back.  03/16/17.

## 2017-03-17 NOTE — Telephone Encounter (Signed)
Message relayed to patient. Verbalized understanding and denied questions.   

## 2017-03-23 ENCOUNTER — Ambulatory Visit (INDEPENDENT_AMBULATORY_CARE_PROVIDER_SITE_OTHER): Payer: Medicare Other

## 2017-03-23 VITALS — BP 100/54 | HR 80 | Temp 98.3°F | Resp 16 | Ht 62.0 in

## 2017-03-23 DIAGNOSIS — G809 Cerebral palsy, unspecified: Secondary | ICD-10-CM

## 2017-03-23 DIAGNOSIS — Z0000 Encounter for general adult medical examination without abnormal findings: Secondary | ICD-10-CM | POA: Diagnosis not present

## 2017-03-23 DIAGNOSIS — I10 Essential (primary) hypertension: Secondary | ICD-10-CM

## 2017-03-23 LAB — URINALYSIS, ROUTINE W REFLEX MICROSCOPIC
BILIRUBIN UA: NEGATIVE
Glucose, UA: NEGATIVE
KETONES UA: NEGATIVE
Nitrite, UA: NEGATIVE
Protein, UA: NEGATIVE
RBC UA: NEGATIVE
UUROB: 0.2 mg/dL (ref 0.2–1.0)
pH, UA: 6 (ref 5.0–7.5)

## 2017-03-23 LAB — MICROSCOPIC EXAMINATION
Bacteria, UA: NONE SEEN
RBC MICROSCOPIC, UA: NONE SEEN /HPF (ref 0–?)

## 2017-03-23 NOTE — Progress Notes (Signed)
Subjective:   Suzanne Jordan is a 71 y.o. female who presents for Medicare Annual (Subsequent) preventive examination.  Review of Systems:   Cardiac Risk Factors include: hypertension;advanced age (>41men, >63 women)     Objective:     Vitals: BP (!) 100/54 (BP Location: Left Arm, Patient Position: Sitting)   Pulse 80   Temp 98.3 F (36.8 C) (Oral)   Resp 16   Ht 5\' 2"  (1.575 m)   BMI 19.75 kg/m   Body mass index is 19.75 kg/m.   Tobacco Social History   Tobacco Use  Smoking Status Never Smoker  Smokeless Tobacco Never Used     Counseling given: Not Answered   Past Medical History:  Diagnosis Date  . Cancer ([place]) 2010   T1c, N0, ER/ PR +. 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   . Cerebral palsy ([place])    at birth   Past Surgical History:  Procedure Laterality Date  . ABDOMINAL HYSTERECTOMY  1970's  . BREAST LUMPECTOMY Right 2010  . BREAST SURGERY Right 2010   wide excision with sn biopsy  . CATARACT EXTRACTION    . FRACTURE SURGERY Right    right leg, as a child  . TUBAL LIGATION     Family History  Problem Relation Age of Onset  . Stroke Father        passed Jan 2014 age 44  . Cancer Other        colon,breast,ovarian cancers, relationships not listed  . Breast cancer Neg Hx    Social History   Substance and Sexual Activity  Sexual Activity Not on file    Outpatient Encounter Medications as of 03/23/2017  Medication Sig  . aspirin 81 MG tablet Take 81 mg by mouth daily.  . benazepril (LOTENSIN) 40 MG tablet Take 1 tablet (40 mg total) by mouth daily.  . diazepam (VALIUM) 10 MG tablet Take 1 tablet (10 mg total) by mouth every 12 (twelve) hours as needed for anxiety.  . fluticasone (FLONASE) 50 MCG/ACT nasal spray Place 2 sprays into both nostrils daily.  . hydrochlorothiazide (MICROZIDE) 12.5 MG capsule Take 1 capsule (12.5 mg total) by mouth daily.  . Multiple Vitamin (MULTIVITAMIN) capsule Take 1 capsule by mouth daily.  . verapamil (CALAN-SR) 240  MG CR tablet verapamil ER (SR) 240 mg tablet,extended release   No facility-administered encounter medications on file as of 03/23/2017.     Activities of Daily Living In your present state of health, do you have any difficulty performing the following activities: 03/23/2017  Hearing? N  Vision? N  Difficulty concentrating or making decisions? N  Walking or climbing stairs? Y  Dressing or bathing? N  Doing errands, shopping? Y  Comment takes Higher education careers adviser and eating ? N  Using the Toilet? N  In the past six months, have you accidently leaked urine? N  Do you have problems with loss of bowel control? N  Managing your Medications? N  Managing your Finances? N  Housekeeping or managing your Housekeeping? N  Some recent data might be hidden    Patient Care Team: Guadalupe Maple, MD as PCP - General (Family Medicine) Bary Castilla, Forest Gleason, MD (General Surgery) Guadalupe Maple, MD (Family Medicine)    Assessment:     Exercise Activities and Dietary recommendations Current Exercise Habits: The patient does not participate in regular exercise at present, Exercise limited by: Other - see comments(cerebral palsy)  Goals    . DIET - INCREASE  WATER INTAKE     Recommend drinking at least 6-8 glasses of water a day       Fall Risk Fall Risk  03/23/2017 09/17/2016 03/19/2016 01/24/2015  Falls in the past year? No Exclusion - non ambulatory No No  Risk for fall due to : Impaired balance/gait - - Impaired balance/gait;Impaired mobility   Depression Screen PHQ 2/9 Scores 03/23/2017 03/19/2016 01/24/2015  PHQ - 2 Score 0 0 0     Cognitive Function     6CIT Screen 03/23/2017  What Year? 0 points  What month? 0 points  What time? 0 points  Count back from 20 0 points  Months in reverse 0 points  Repeat phrase 0 points  Total Score 0    Immunization History  Administered Date(s) Administered  . Influenza Split 12/28/2015  . Influenza, High Dose  Seasonal PF 01/13/2017  . Influenza-Unspecified 12/27/2012, 12/28/2014, 01/10/2015  . Pneumococcal Conjugate-13 11/23/2013  . Pneumococcal Polysaccharide-23 02/08/2002, 02/27/2010  . Tdap 10/09/2008  . Zoster 08/14/2010   Screening Tests Health Maintenance  Topic Date Due  . DEXA SCAN  03/23/2018 (Originally 11/15/2009)  . COLONOSCOPY  03/23/2018 (Originally 11/16/1994)  . MAMMOGRAM  08/06/2018  . TETANUS/TDAP  10/10/2018  . INFLUENZA VACCINE  Completed  . Hepatitis C Screening  Completed  . PNA vac Low Risk Adult  Completed      Plan:    I have personally reviewed and addressed the Medicare Annual Wellness questionnaire and have noted the following in the patient's chart:  A. Medical and social history B. Use of alcohol, tobacco or illicit drugs  C. Current medications and supplements D. Functional ability and status E.  Nutritional status F.  Physical activity G. Advance directives H. List of other physicians I.  Hospitalizations, surgeries, and ER visits in previous 12 months J.  [place] such as hearing and vision if needed, cognitive and depression L. Referrals and appointments   In addition, I have reviewed and discussed with patient certain preventive protocols, quality metrics, and best practice recommendations. A written personalized care plan for preventive services as well as general preventive health recommendations were provided to patient.   Signed,  Tyler Aas, LPN Nurse Health Advisor   Nurse Notes: none

## 2017-03-23 NOTE — Patient Instructions (Signed)
Suzanne Jordan , Thank you for taking time to come for your Medicare Wellness Visit. I appreciate your ongoing commitment to your health goals. Please review the following plan we discussed and let me know if I can assist you in the future.   Screening recommendations/referrals: Colonoscopy: due now Mammogram: completed 08/05/2016 Bone Density: due now Recommended yearly ophthalmology/optometry visit for glaucoma screening and checkup Recommended yearly dental visit for hygiene and checkup  Vaccinations: Influenza vaccine: due now Pneumococcal vaccine: up to date Tdap vaccine: up to date Shingles vaccine: up to date  Advanced directives:Please bring a copy of your health care power of attorney and living will to the office at your convenience.  Conditions/risks identified: Recommend drinking at least 6-8 glasses of water a day   Next appointment: Follow up on 04/01/2017 at 8:00am with [place]. Follow up in one year for your annual wellness exam.    Preventive Care 65 Years and Older, Female Preventive care refers to lifestyle choices and visits with your health care provider that can promote health and wellness. What does preventive care include?  A yearly physical exam. This is also called an annual well check.  Dental exams once or twice a year.  Routine eye exams. Ask your health care provider how often you should have your eyes checked.  Personal lifestyle choices, including:  Daily care of your teeth and gums.  Regular physical activity.  Eating a healthy diet.  Avoiding tobacco and drug use.  Limiting alcohol use.  Practicing safe sex.  Taking low-dose aspirin every day.  Taking vitamin and mineral supplements as recommended by your health care provider. What happens during an annual well check? The services and screenings done by your health care provider during your annual well check will depend on your age, overall health, lifestyle risk factors, and family  history of disease. Counseling  Your health care provider may ask you questions about your:  Alcohol use.  Tobacco use.  Drug use.  Emotional well-being.  Home and relationship well-being.  Sexual activity.  Eating habits.  History of falls.  Memory and ability to understand (cognition).  Work and work Statistician.  Reproductive health. Screening  You may have the following tests or measurements:  Height, weight, and BMI.  Blood pressure.  Lipid and cholesterol levels. These may be checked every 5 years, or more frequently if you are over 20 years old.  Skin check.  Lung cancer screening. You may have this screening every year starting at age 50 if you have a 30-pack-year history of smoking and currently smoke or have quit within the past 15 years.  Fecal occult blood test (FOBT) of the stool. You may have this test every year starting at age 74.  Flexible sigmoidoscopy or colonoscopy. You may have a sigmoidoscopy every 5 years or a colonoscopy every 10 years starting at age 25.  Hepatitis C blood test.  Hepatitis B blood test.  Sexually transmitted disease (STD) testing.  Diabetes screening. This is done by checking your blood sugar (glucose) after you have not eaten for a while (fasting). You may have this done every 1-3 years.  Bone density scan. This is done to screen for osteoporosis. You may have this done starting at age 66.  Mammogram. This may be done every 1-2 years. Talk to your health care provider about how often you should have regular mammograms. Talk with your health care provider about your test results, treatment options, and if necessary, the need for more tests. Vaccines  Your health care provider may recommend certain vaccines, such as:  Influenza vaccine. This is recommended every year.  Tetanus, diphtheria, and acellular pertussis (Tdap, Td) vaccine. You may need a Td booster every 10 years.  Zoster vaccine. You may need this after  age 66.  Pneumococcal 13-valent conjugate (PCV13) vaccine. One dose is recommended after age 14.  Pneumococcal polysaccharide (PPSV23) vaccine. One dose is recommended after age 74. Talk to your health care provider about which screenings and vaccines you need and how often you need them. This information is not intended to replace advice given to you by your health care provider. Make sure you discuss any questions you have with your health care provider. Document Released: 05/11/2015 Document Revised: 01/02/2016 Document Reviewed: 02/13/2015 Elsevier Interactive Patient Education  2017 [employer] Prevention in the Home Falls can cause injuries. They can happen to people of all ages. There are many things you can do to make your home safe and to help prevent falls. What can I do on the outside of my home?  Regularly fix the edges of walkways and driveways and fix any cracks.  Remove anything that might make you trip as you walk through a door, such as a raised step or threshold.  Trim any bushes or trees on the path to your home.  Use bright outdoor lighting.  Clear any walking paths of anything that might make someone trip, such as rocks or tools.  Regularly check to see if handrails are loose or broken. Make sure that both sides of any steps have handrails.  Any raised decks and porches should have guardrails on the edges.  Have any leaves, snow, or ice cleared regularly.  Use sand or salt on walking paths during winter.  Clean up any spills in your garage right away. This includes oil or grease spills. What can I do in the bathroom?  Use night lights.  Install grab bars by the toilet and in the tub and shower. Do not use towel bars as grab bars.  Use non-skid mats or decals in the tub or shower.  If you need to sit down in the shower, use a plastic, non-slip stool.  Keep the floor dry. Clean up any water that spills on the floor as soon as it  happens.  Remove soap buildup in the tub or shower regularly.  Attach bath mats securely with double-sided non-slip rug tape.  Do not have throw rugs and other things on the floor that can make you trip. What can I do in the bedroom?  Use night lights.  Make sure that you have a light by your bed that is easy to reach.  Do not use any sheets or blankets that are too big for your bed. They should not hang down onto the floor.  Have a firm chair that has side arms. You can use this for support while you get dressed.  Do not have throw rugs and other things on the floor that can make you trip. What can I do in the kitchen?  Clean up any spills right away.  Avoid walking on wet floors.  Keep items that you use a lot in easy-to-reach places.  If you need to reach something above you, use a strong step stool that has a grab bar.  Keep electrical cords out of the way.  Do not use floor polish or wax that makes floors slippery. If you must use wax, use non-skid floor wax.  Do  not have throw rugs and other things on the floor that can make you trip. What can I do with my stairs?  Do not leave any items on the stairs.  Make sure that there are handrails on both sides of the stairs and use them. Fix handrails that are broken or loose. Make sure that handrails are as long as the stairways.  Check any carpeting to make sure that it is firmly attached to the stairs. Fix any carpet that is loose or worn.  Avoid having throw rugs at the top or bottom of the stairs. If you do have throw rugs, attach them to the floor with carpet tape.  Make sure that you have a light switch at the top of the stairs and the bottom of the stairs. If you do not have them, ask someone to add them for you. What else can I do to help prevent falls?  Wear shoes that:  Do not have high heels.  Have rubber bottoms.  Are comfortable and fit you well.  Are closed at the toe. Do not wear sandals.  If you  use a stepladder:  Make sure that it is fully opened. Do not climb a closed stepladder.  Make sure that both sides of the stepladder are locked into place.  Ask someone to hold it for you, if possible.  Clearly mark and make sure that you can see:  Any grab bars or handrails.  First and last steps.  Where the edge of each step is.  Use tools that help you move around (mobility aids) if they are needed. These include:  Canes.  Walkers.  Scooters.  Crutches.  Turn on the lights when you go into a dark area. Replace any light bulbs as soon as they burn out.  Set up your furniture so you have a clear path. Avoid moving your furniture around.  If any of your floors are uneven, fix them.  If there are any pets around you, be aware of where they are.  Review your medicines with your doctor. Some medicines can make you feel dizzy. This can increase your chance of falling. Ask your doctor what other things that you can do to help prevent falls. This information is not intended to replace advice given to you by your health care provider. Make sure you discuss any questions you have with your health care provider. Document Released: 02/08/2009 Document Revised: 09/20/2015 Document Reviewed: 05/19/2014 Elsevier Interactive Patient Education  2017 [employer].

## 2017-03-24 ENCOUNTER — Other Ambulatory Visit: Payer: Self-pay | Admitting: Family Medicine

## 2017-03-24 DIAGNOSIS — I10 Essential (primary) hypertension: Secondary | ICD-10-CM

## 2017-03-24 LAB — CBC WITH DIFFERENTIAL/PLATELET
BASOS ABS: 0 10*3/uL (ref 0.0–0.2)
Basos: 0 %
EOS (ABSOLUTE): 0.1 10*3/uL (ref 0.0–0.4)
Eos: 1 %
Hematocrit: 37.7 % (ref 34.0–46.6)
Hemoglobin: 12.5 g/dL (ref 11.1–15.9)
IMMATURE GRANULOCYTES: 0 %
Immature Grans (Abs): 0 10*3/uL (ref 0.0–0.1)
LYMPHS ABS: 2.4 10*3/uL (ref 0.7–3.1)
Lymphs: 30 %
MCH: 29.5 pg (ref 26.6–33.0)
MCHC: 33.2 g/dL (ref 31.5–35.7)
MCV: 89 fL (ref 79–97)
MONOS ABS: 0.8 10*3/uL (ref 0.1–0.9)
Monocytes: 9 %
NEUTROS PCT: 60 %
Neutrophils Absolute: 4.9 10*3/uL (ref 1.4–7.0)
PLATELETS: 353 10*3/uL (ref 150–379)
RBC: 4.24 x10E6/uL (ref 3.77–5.28)
RDW: 14.1 % (ref 12.3–15.4)
WBC: 8.1 10*3/uL (ref 3.4–10.8)

## 2017-03-24 LAB — TSH: TSH: 1.51 u[IU]/mL (ref 0.450–4.500)

## 2017-03-24 LAB — COMPREHENSIVE METABOLIC PANEL
ALK PHOS: 266 IU/L — AB (ref 39–117)
ALT: 44 IU/L — AB (ref 0–32)
AST: 62 IU/L — AB (ref 0–40)
Albumin/Globulin Ratio: 1.6 (ref 1.2–2.2)
Albumin: 4.4 g/dL (ref 3.5–4.8)
BUN/Creatinine Ratio: 12 (ref 12–28)
BUN: 13 mg/dL (ref 8–27)
Bilirubin Total: 0.4 mg/dL (ref 0.0–1.2)
CALCIUM: 10.3 mg/dL (ref 8.7–10.3)
CO2: 24 mmol/L (ref 20–29)
CREATININE: 1.09 mg/dL — AB (ref 0.57–1.00)
Chloride: 101 mmol/L (ref 96–106)
GFR calc Af Amer: 59 mL/min/{1.73_m2} — ABNORMAL LOW (ref >59)
GFR calc non Af Amer: 51 mL/min/{1.73_m2} — ABNORMAL LOW (ref >59)
GLOBULIN, TOTAL: 2.7 g/dL (ref 1.5–4.5)
GLUCOSE: 101 mg/dL — AB (ref 65–99)
Potassium: 4.1 mmol/L (ref 3.5–5.2)
SODIUM: 142 mmol/L (ref 134–144)
Total Protein: 7.1 g/dL (ref 6.0–8.5)

## 2017-04-01 ENCOUNTER — Encounter: Payer: Self-pay | Admitting: Family Medicine

## 2017-04-01 ENCOUNTER — Ambulatory Visit (INDEPENDENT_AMBULATORY_CARE_PROVIDER_SITE_OTHER): Payer: Medicare Other | Admitting: Family Medicine

## 2017-04-01 VITALS — BP 110/71 | HR 105

## 2017-04-01 DIAGNOSIS — Z0001 Encounter for general adult medical examination with abnormal findings: Secondary | ICD-10-CM | POA: Diagnosis not present

## 2017-04-01 DIAGNOSIS — I10 Essential (primary) hypertension: Secondary | ICD-10-CM

## 2017-04-01 DIAGNOSIS — G809 Cerebral palsy, unspecified: Secondary | ICD-10-CM | POA: Diagnosis not present

## 2017-04-01 DIAGNOSIS — R748 Abnormal levels of other serum enzymes: Secondary | ICD-10-CM | POA: Diagnosis not present

## 2017-04-01 DIAGNOSIS — Z7189 Other specified counseling: Secondary | ICD-10-CM | POA: Insufficient documentation

## 2017-04-01 DIAGNOSIS — Z0000 Encounter for general adult medical examination without abnormal findings: Secondary | ICD-10-CM

## 2017-04-01 MED ORDER — FLUTICASONE PROPIONATE 50 MCG/ACT NA SUSP: 2.0000 | Freq: Every day | NASAL | 12 refills | Status: AC

## 2017-04-01 MED ORDER — BENAZEPRIL HCL 40 MG PO TABS: 40.0000 mg | ORAL_TABLET | Freq: Every day | ORAL | 12 refills | Status: DC

## 2017-04-01 NOTE — Assessment & Plan Note (Signed)
The current medical regimen is effective;  continue present plan and medications.  

## 2017-04-01 NOTE — Assessment & Plan Note (Signed)
A voluntary discussion about advance care planning including the explanation and discussion of advance directives was extensively discussed  with the patient.  Explanation about the health care proxy and Living will was reviewed and packet with forms with explanation of how to fill them out was given.    

## 2017-04-01 NOTE — Progress Notes (Signed)
BP 110/71   Pulse (!) 105   SpO2 98%    Subjective:    Patient ID: Suzanne Jordan, female    DOB: Apr 10, 1946, 71 y.o.   MRN: [id number]  HPI: Suzanne Jordan is a 71 y.o. female  Chief Complaint  Patient presents with  . Annual Exam  patient follow-up doing well cerebral palsy is stable. Patient's still having marked spasticity management [facility] with Valium.. Patient in a rolling chair for mobility. He also has an Conservator, museum/gallery for mobility. Also has a caregiver who assists patient with home duties. She is also been assessed for  Disability modifications of her bathroom with an elevated commode. This is recommended by me. Blood pressure doing well wino complaints takes medications without problems. No further issues with breast cancer.  Relevant past medical, surgical, family and social history reviewed and updated as indicated. Interim medical history since our last visit reviewed. Allergies and medications reviewed and updated.  Review of Systems  Constitutional: Negative.   HENT: Negative.   Eyes: Negative.   Respiratory: Negative.   Cardiovascular: Negative.   Gastrointestinal: Negative.   Endocrine: Negative.   Genitourinary: Negative.   Musculoskeletal: Negative.   Skin: Negative.   Allergic/Immunologic: Negative.   Neurological: Negative.   Hematological: Negative.   Psychiatric/Behavioral: Negative.     Per HPI unless specifically indicated above     Objective:    BP 110/71   Pulse (!) 105   SpO2 98%   Wt Readings from Last 3 Encounters:  09/17/16 108 lb (49 kg)  09/03/16 108 lb (49 kg)  07/17/15 108 lb (49 kg)    Physical Exam  Constitutional: She is oriented to person, place, and time. She appears well-developed and well-nourished.  HENT:  Head: Normocephalic and atraumatic.  Right Ear: External ear normal.  Left Ear: External ear normal.  Nose: Nose normal.  Mouth/Throat: Oropharynx is clear and moist.  Eyes: Conjunctivae and EOM are  normal. Pupils are equal, round, and reactive to light.  Neck: Normal range of motion. Neck supple. Carotid bruit is not present.  Cardiovascular: Normal rate, regular rhythm and normal heart sounds.  No murmur heard. Pulmonary/Chest: Effort normal and breath sounds normal.  Abdominal: Soft. Bowel sounds are normal. There is no hepatosplenomegaly.  Musculoskeletal: Normal range of motion.  Changes of MS  Neurological: She is alert and oriented to person, place, and time.  Skin: No rash noted.  Psychiatric: She has a normal mood and affect. Her behavior is normal. Judgment and thought content normal.    Results for orders placed or performed in visit on 03/23/17  Microscopic Examination  Result Value Ref Range   WBC, UA 0-5 0 - 5 /hpf   RBC, UA None seen 0 - 2 /hpf   Epithelial Cells (non renal) CANCELED    Renal Epithel, UA 0-10 (A) None seen /hpf   Bacteria, UA None seen None seen/Few  CBC with Differential  Result Value Ref Range   WBC 8.1 3.4 - 10.8 x10E3/uL   RBC 4.24 3.77 - 5.28 x10E6/uL   Hemoglobin 12.5 11.1 - 15.9 g/dL   Hematocrit 37.7 34.0 - 46.6 %   MCV 89 79 - 97 fL   MCH 29.5 26.6 - 33.0 pg   MCHC 33.2 31.5 - 35.7 g/dL   RDW 14.1 12.3 - 15.4 %   Platelets 353 150 - 379 x10E3/uL   Neutrophils 60 Not Estab. %   Lymphs 30 Not Estab. %   Monocytes  9 Not Estab. %   Eos 1 Not Estab. %   Basos 0 Not Estab. %   Neutrophils Absolute 4.9 1.4 - 7.0 x10E3/uL   Lymphocytes Absolute 2.4 0.7 - 3.1 x10E3/uL   Monocytes Absolute 0.8 0.1 - 0.9 x10E3/uL   EOS (ABSOLUTE) 0.1 0.0 - 0.4 x10E3/uL   Basophils Absolute 0.0 0.0 - 0.2 x10E3/uL   Immature Granulocytes 0 Not Estab. %   Immature Grans (Abs) 0.0 0.0 - 0.1 x10E3/uL  Comp Met (CMET)  Result Value Ref Range   Glucose 101 (H) 65 - 99 mg/dL   BUN 13 8 - 27 mg/dL   Creatinine, Ser 1.09 (H) 0.57 - 1.00 mg/dL   GFR calc non Af Amer 51 (L) >59 mL/min/1.73   GFR calc Af Amer 59 (L) >59 mL/min/1.73   BUN/Creatinine Ratio 12 12  - 28   Sodium 142 134 - 144 mmol/L   Potassium 4.1 3.5 - 5.2 mmol/L   Chloride 101 96 - 106 mmol/L   CO2 24 20 - 29 mmol/L   Calcium 10.3 8.7 - 10.3 mg/dL   Total Protein 7.1 6.0 - 8.5 g/dL   Albumin 4.4 3.5 - 4.8 g/dL   Globulin, Total 2.7 1.5 - 4.5 g/dL   Albumin/Globulin Ratio 1.6 1.2 - 2.2   Bilirubin Total 0.4 0.0 - 1.2 mg/dL   Alkaline Phosphatase 266 (H) 39 - 117 IU/L   AST 62 (H) 0 - 40 IU/L   ALT 44 (H) 0 - 32 IU/L  TSH  Result Value Ref Range   TSH 1.510 0.450 - 4.500 uIU/mL  Urinalysis, Routine w reflex microscopic  Result Value Ref Range   Specific Gravity, UA <1.005 (L) 1.005 - 1.030   pH, UA 6.0 5.0 - 7.5   Color, UA Yellow Yellow   Appearance Ur Clear Clear   Leukocytes, UA Trace (A) Negative   Protein, UA Negative Negative/Trace   Glucose, UA Negative Negative   Ketones, UA Negative Negative   RBC, UA Negative Negative   Bilirubin, UA Negative Negative   Urobilinogen, Ur 0.2 0.2 - 1.0 mg/dL   Nitrite, UA Negative Negative   Microscopic Examination See below:       Assessment & Plan:   Problem List Items Addressed This Visit      Cardiovascular and Mediastinum   Essential hypertension    The current medical regimen is effective;  continue present plan and medications.       Relevant Medications   benazepril (LOTENSIN) 40 MG tablet   Other Relevant Orders   Comprehensive metabolic panel     Nervous and Auditory   Cerebral palsy (HCC) - Primary    The current medical regimen is effective;  continue present plan and medications.       Relevant Orders   Comprehensive metabolic panel     Other   Advanced care planning/counseling discussion    A voluntary discussion about advance care planning including the explanation and discussion of advance directives was extensively discussed  with the patient.  Explanation about the health care proxy and Living will was reviewed and packet with forms with explanation of how to fill them out was given.            Other Visit Diagnoses    PE (physical exam), annual       Elevated liver enzymes       Relevant Orders   Comprehensive metabolic panel     discussed with patient's blood work slight decline in  renal function and slight elevation in liver enzymes will recheck CMP in about a month or so. Patient will also stop her aspirin.  Follow up plan: Return in about 6 months (around 09/30/2017) for BMP.

## 2017-05-19 ENCOUNTER — Other Ambulatory Visit: Payer: Medicare Other

## 2017-05-19 DIAGNOSIS — I10 Essential (primary) hypertension: Secondary | ICD-10-CM | POA: Diagnosis not present

## 2017-05-19 DIAGNOSIS — G809 Cerebral palsy, unspecified: Secondary | ICD-10-CM

## 2017-05-19 DIAGNOSIS — R748 Abnormal levels of other serum enzymes: Secondary | ICD-10-CM

## 2017-05-20 ENCOUNTER — Telehealth: Payer: Self-pay | Admitting: Family Medicine

## 2017-05-20 DIAGNOSIS — R748 Abnormal levels of other serum enzymes: Secondary | ICD-10-CM

## 2017-05-20 LAB — COMPREHENSIVE METABOLIC PANEL
ALBUMIN: 4.2 g/dL (ref 3.5–4.8)
ALT: 42 IU/L — ABNORMAL HIGH (ref 0–32)
AST: 81 IU/L — ABNORMAL HIGH (ref 0–40)
Albumin/Globulin Ratio: 1.4 (ref 1.2–2.2)
Alkaline Phosphatase: 356 IU/L — ABNORMAL HIGH (ref 39–117)
BUN/Creatinine Ratio: 12 (ref 12–28)
BUN: 13 mg/dL (ref 8–27)
Bilirubin Total: 0.7 mg/dL (ref 0.0–1.2)
CALCIUM: 9.8 mg/dL (ref 8.7–10.3)
CO2: 23 mmol/L (ref 20–29)
Chloride: 90 mmol/L — ABNORMAL LOW (ref 96–106)
Creatinine, Ser: 1.05 mg/dL — ABNORMAL HIGH (ref 0.57–1.00)
GFR, EST AFRICAN AMERICAN: 61 mL/min/{1.73_m2} (ref >59)
GFR, EST NON AFRICAN AMERICAN: 53 mL/min/{1.73_m2} — AB (ref >59)
GLOBULIN, TOTAL: 2.9 g/dL (ref 1.5–4.5)
Glucose: 105 mg/dL — ABNORMAL HIGH (ref 65–99)
Potassium: 3.9 mmol/L (ref 3.5–5.2)
SODIUM: 132 mmol/L — AB (ref 134–144)
TOTAL PROTEIN: 7.1 g/dL (ref 6.0–8.5)

## 2017-05-20 NOTE — Telephone Encounter (Signed)
Discussed with patient worsening liver enzymes will order ultrasound of upper right quadrant

## 2017-05-26 ENCOUNTER — Telehealth: Payer: Self-pay | Admitting: Family Medicine

## 2017-05-26 ENCOUNTER — Ambulatory Visit
Admission: RE | Admit: 2017-05-26 | Discharge: 2017-05-26 | Disposition: A | Discharge status: Home / Self Care | Payer: Medicare Other | Source: Ambulatory Visit | Attending: Family Medicine | Admitting: Family Medicine

## 2017-05-26 DIAGNOSIS — K769 Liver disease, unspecified: Secondary | ICD-10-CM

## 2017-05-26 DIAGNOSIS — R748 Abnormal levels of other serum enzymes: Secondary | ICD-10-CM | POA: Diagnosis not present

## 2017-05-26 DIAGNOSIS — R160 Hepatomegaly, not elsewhere classified: Secondary | ICD-10-CM | POA: Insufficient documentation

## 2017-05-26 NOTE — Telephone Encounter (Signed)
Phone call Discussed with patient abnormal ultrasound with multiple liver cyst like lesions will schedule hepatic protocol MRI as requested by radiology.

## 2017-05-26 NOTE — Telephone Encounter (Signed)
Copied from [place] [id number]. Topic: General - Other >> May 26, 2017 11:40 AM Darl Householder, RMA wrote: Reason for CRM: pt is requesting a call back from Dr. Jeananne Rama CMA concerning MRI appt

## 2017-05-26 NOTE — Telephone Encounter (Signed)
-----   Message from Amada Kingfisher, [place] sent at 05/26/2017 11:18 AM EST ----- Patient was transferred to provider for telephone conversation.

## 2017-05-26 NOTE — Telephone Encounter (Signed)
Please see message. °

## 2017-05-27 NOTE — Telephone Encounter (Signed)
I have not scheduled appointment yet for MRI.

## 2017-05-27 NOTE — Telephone Encounter (Signed)
ok 

## 2017-05-27 NOTE — Telephone Encounter (Signed)
Patient states that her friend that takes her and go with her to appointments wants her MRI scheduled in the middle of March because her friend will be out of town till then.

## 2017-06-25 DIAGNOSIS — G802 Spastic hemiplegic cerebral palsy: Secondary | ICD-10-CM | POA: Diagnosis not present

## 2017-06-29 ENCOUNTER — Other Ambulatory Visit: Payer: Self-pay | Admitting: General Surgery

## 2017-07-09 ENCOUNTER — Ambulatory Visit
Admission: RE | Admit: 2017-07-09 | Discharge: 2017-07-09 | Disposition: A | Discharge status: Home / Self Care | Payer: Medicare Other | Source: Ambulatory Visit | Attending: Family Medicine | Admitting: Family Medicine

## 2017-07-09 DIAGNOSIS — K769 Liver disease, unspecified: Secondary | ICD-10-CM

## 2017-07-09 DIAGNOSIS — R748 Abnormal levels of other serum enzymes: Secondary | ICD-10-CM | POA: Insufficient documentation

## 2017-07-09 DIAGNOSIS — M899 Disorder of bone, unspecified: Secondary | ICD-10-CM | POA: Insufficient documentation

## 2017-07-09 DIAGNOSIS — K7689 Other specified diseases of liver: Secondary | ICD-10-CM | POA: Insufficient documentation

## 2017-07-09 DIAGNOSIS — C220 Liver cell carcinoma: Secondary | ICD-10-CM | POA: Diagnosis not present

## 2017-07-09 MED ORDER — GADOBENATE DIMEGLUMINE 529 MG/ML IV SOLN
9.0000 mL | Freq: Once | INTRAVENOUS | Status: AC | PRN
  Administered 2017-07-09: 9 mL via INTRAVENOUS

## 2017-07-14 ENCOUNTER — Telehealth: Payer: Self-pay | Admitting: Family Medicine

## 2017-07-14 DIAGNOSIS — I10 Essential (primary) hypertension: Secondary | ICD-10-CM

## 2017-07-14 MED ORDER — HYDROCHLOROTHIAZIDE 12.5 MG PO CAPS
12.5000 mg | ORAL_CAPSULE | Freq: Every day | ORAL | 12 refills | Status: DC
Start: 2017-07-14 — End: 2017-09-21

## 2017-07-14 NOTE — Telephone Encounter (Signed)
Copied from [place] #[id number]. Topic: Quick Communication - Rx Refill/Question >> Jul 14, 2017 11:40 AM Boyd Kerbs wrote: Medication:  hydrochlorothiazide (MICROZIDE) 12.5 MG capsule  [facility] told them that they did not have prescription.  Could not get it from them,  shows had refills   Has the patient contacted their pharmacy? Yes.     (Agent: If no, request that the patient contact the pharmacy for the refill.)   Preferred Pharmacy (with phone number or street name): [place], [place] [address] Phone: [phone] Fax: [phone]     Agent: Please be advised that RX refills may take up to 3 business days. We ask that you follow-up with your pharmacy.

## 2017-07-16 ENCOUNTER — Other Ambulatory Visit: Payer: Self-pay

## 2017-07-16 DIAGNOSIS — Z1231 Encounter for screening mammogram for malignant neoplasm of breast: Secondary | ICD-10-CM

## 2017-07-17 ENCOUNTER — Telehealth: Payer: Self-pay | Admitting: Family Medicine

## 2017-07-17 ENCOUNTER — Other Ambulatory Visit: Payer: Self-pay | Admitting: *Deleted

## 2017-07-17 DIAGNOSIS — I10 Essential (primary) hypertension: Secondary | ICD-10-CM

## 2017-07-17 MED ORDER — VERAPAMIL HCL ER 240 MG PO TBCR: EXTENDED_RELEASE_TABLET | ORAL | 12 refills | Status: DC

## 2017-07-17 NOTE — Telephone Encounter (Signed)
Rx forwarded to new pharmacy per request.

## 2017-07-17 NOTE — Telephone Encounter (Signed)
Copied from [place] [id number]. Topic: Quick Communication - Rx Refill/Question >> Jul 17, 2017  8:26 AM Lolita Rieger, RMA wrote: Medication: verapamil 240 mg Has the patient contacted their pharmacy?yes (Agent: If no, request that the patient contact the pharmacy for the refill.) Preferred Pharmacy (with phone number or street name): [facility] [place], [place]. [phone] (Phone)  Agent: Please be advised that RX refills may take up to 3 business days. We ask that you follow-up with your pharmacy.

## 2017-07-23 ENCOUNTER — Ambulatory Visit: Payer: Medicare Other | Admitting: General Surgery

## 2017-08-03 ENCOUNTER — Telehealth: Payer: Self-pay | Admitting: Family Medicine

## 2017-08-03 MED ORDER — AZITHROMYCIN 250 MG PO TABS: ORAL_TABLET | ORAL | 0 refills | Status: DC

## 2017-08-03 MED ORDER — GUAIFENESIN ER 600 MG PO TB12: 600.0000 mg | ORAL_TABLET | Freq: Two times a day (BID) | ORAL | 3 refills | Status: DC | PRN

## 2017-08-03 NOTE — Telephone Encounter (Signed)
Copied from [place] [id number]. Topic: Inquiry >> Aug 03, 2017  8:51 AM Margot Ables wrote: Reason for CRM: Pt calling asking for a medication to be sent in for her. Pt states she has a bad cold and cannot come in because she has no one to bring her. Pt states symptoms stopped up nose, coughing, low grade fever, and sneezing. She is wanting something mild to help clear the mucus.  [place], [place] [phone] (Phone) [phone] (Fax)

## 2017-08-11 DIAGNOSIS — J209 Acute bronchitis, unspecified: Secondary | ICD-10-CM | POA: Diagnosis not present

## 2017-08-13 ENCOUNTER — Ambulatory Visit: Payer: Medicare Other | Admitting: General Surgery

## 2017-08-25 ENCOUNTER — Ambulatory Visit (INDEPENDENT_AMBULATORY_CARE_PROVIDER_SITE_OTHER): Payer: Medicare Other | Admitting: General Surgery

## 2017-08-25 ENCOUNTER — Encounter: Payer: Self-pay | Admitting: General Surgery

## 2017-08-25 VITALS — BP 98/66 | HR 68 | Resp 14 | Ht 62.0 in | Wt 105.0 lb

## 2017-08-25 DIAGNOSIS — R160 Hepatomegaly, not elsewhere classified: Secondary | ICD-10-CM

## 2017-08-25 DIAGNOSIS — Z853 Personal history of malignant neoplasm of breast: Secondary | ICD-10-CM | POA: Diagnosis not present

## 2017-08-25 NOTE — Patient Instructions (Addendum)
The patient is aware to call back for any questions or concerns. Follow up appointment to be announced. 

## 2017-08-25 NOTE — Progress Notes (Signed)
Patient ID: Suzanne Jordan, female   DOB: [birth year], 72 y.o.   MRN: [id number]  Chief Complaint  Patient presents with  . Follow-up    HPI Suzanne Jordan is a 72 y.o. female.  Here for discussion, results MRI liver completed on 07-09-17. Patient states she fell about a week ago.   The patient had routine liver function studies checked in fall 2018 was noted to have mild elevation of the serum transaminases.  On follow-up these were further increased prompting a MRI to be scheduled.  This showed innumerable 1 cm masses throughout both lobes of liver suggestive of metastatic disease.  The patient is seen for assessment.  She denies any abdominal pain, change in appetite, change in bowel function or worsening of her motor strength.  HPI  Past Medical History:  Diagnosis Date  . 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   . Cerebral palsy ([place])    at birth    Past Surgical History:  Procedure Laterality Date  . ABDOMINAL HYSTERECTOMY  1970's  . BREAST LUMPECTOMY Right 2010  . BREAST SURGERY Right 2010   wide excision with sn biopsy  . CATARACT EXTRACTION    . FRACTURE SURGERY Right    right leg, as a child  . TUBAL LIGATION      Family History  Problem Relation Age of Onset  . Stroke Father        passed Jan 2014 age 53  . Cancer Other        colon,breast,ovarian cancers, relationships not listed  . Breast cancer Neg Hx     Social History Social History   Tobacco Use  . Smoking status: Never Smoker  . Smokeless tobacco: Never Used  Substance Use Topics  . Alcohol use: No  . Drug use: No    No Known Allergies  Current Outpatient Medications  Medication Sig Dispense Refill  . aspirin 81 MG tablet Take 81 mg by mouth daily.    . diazepam (VALIUM) 10 MG tablet Take 1 tablet (10 mg total) by mouth every 12 (twelve) hours as needed for anxiety. 60 tablet 1  . fluticasone (FLONASE) 50 MCG/ACT nasal spray Place 2 sprays into both nostrils  daily. 16 g 12  . guaiFENesin (MUCINEX) 600 MG 12 hr tablet Take 1 tablet (600 mg total) by mouth 2 (two) times daily as needed. 30 tablet 3  . hydrochlorothiazide (MICROZIDE) 12.5 MG capsule Take 1 capsule (12.5 mg total) by mouth daily. 30 capsule 12  . Multiple Vitamin (MULTIVITAMIN) capsule Take 1 capsule by mouth daily.    . verapamil (CALAN-SR) 240 MG CR tablet TAKE ONE (1) TABLET BY MOUTH EVERY DAY 30 tablet 12  . benazepril (LOTENSIN) 40 MG tablet Take 1 tablet (40 mg total) by mouth daily. 30 tablet 12   No current facility-administered medications for this visit.     Review of Systems Review of Systems  Constitutional: Negative.   Respiratory: Negative.   Cardiovascular: Negative.     Blood pressure 98/66, pulse 68, resp. rate 14, height 5' 2" (1.575 m), weight 105 lb (47.6 kg). The patient's weight is down 3 pounds from her May 2018 exam.  Physical Exam Physical Exam  Constitutional: She is oriented to person, place, and time. She appears well-developed and well-nourished.  Cardiovascular: Normal rate, regular rhythm and normal heart sounds.  Pulmonary/Chest: Effort normal and breath sounds normal.    Neurological: She is oriented to person,  place, and time.  Skin: Skin is warm and dry.    Data Reviewed May 26, 2017 ultrasound:  The liver exhibits multiple hyperechoic foci with the largest lying in the right lobe measuring 1.4 x 3.1 x 5.4 cm. A smaller hyperechoic focus lies in the right lobe near the dome measuring 1.5 x 1.1 x 1.8 cm. Portal vein is patent on color Doppler imaging with normal direction of blood flow towards the liver. 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  IMPRESSION: Multiple abnormal masses within the liver. 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 Hepatic protocol MRI is recommended.  July 09, 2017 MRI: IMPRESSION: 1. Severely degraded exam, nearly nondiagnostic. 2. The liver is nearly completely replaced with solid lesions  which are favored to represent metastasis. No primary within the abdomen is seen. Given clinical history, suspicious for breast cancer metastasis. Cirrhosis with multifocal hepatocellular carcinoma felt less likely. Consider ultrasound guided sampling. 3. Equivocal lesion within the right-side of the T12 vertebral body. Recommend attention on follow-up.  CBC of March 23, 2017 was normal with a hemoglobin of 12.5, MCV of 89, white blood cell count of 8100 and a platelet count of 353,000. Assessment    Evidence of multiple liver lesions suggestive of metastatic disease.    Plan    Will obtain baseline laboratory studies to determine if a etiology can be elicited without biopsy.  The patient is 9 years out from a T1, N0 ER/PR positive, HER-2 negative breast cancer.  This would be an unlikely site for unifocal metastatic disease, but must be considered.  The patient was advised that this is likely some sort of cancer, and that treatment options will be reviewed after laboratory studies and possible biopsy of been completed.  Arrangements are in place for a CEA, CA 15-3, CA 27-29, alpha-fetoprotein, PT, PTT, hepatic function panel with LDH and GGT.      Follow up appointment to be announced.  HPI, Physical Exam, Assessment and Plan have been scribed under the direction and in the presence of Hervey Ard, MD. Gaspar Cola, CMA  Patient will have her labs done at the hospital on 08/27/17 before she has her mammogram that day.  Forest Gleason Nicklos Gaxiola 08/26/2017, 5:55 AM

## 2017-08-26 ENCOUNTER — Telehealth: Payer: Self-pay | Admitting: *Deleted

## 2017-08-26 DIAGNOSIS — C229 Malignant neoplasm of liver, not specified as primary or secondary: Secondary | ICD-10-CM

## 2017-08-26 DIAGNOSIS — C787 Secondary malignant neoplasm of liver and intrahepatic bile duct: Secondary | ICD-10-CM | POA: Insufficient documentation

## 2017-08-26 NOTE — Telephone Encounter (Signed)
-----   Message from Robert Bellow, MD sent at 08/26/2017  6:01 AM EDT ----- Please add a CBC with differential to the laboratory studies that the patient will be getting at the hospital later this week.  Diagnosis liver cancer.  Thank you

## 2017-08-26 NOTE — Telephone Encounter (Signed)
CBC with diff order entered in Epic per Dr. Bary Castilla.   Patient to have all labs drawn at [facility] tomorrow per Caryl-Lyn's note.

## 2017-08-27 ENCOUNTER — Ambulatory Visit
Admission: RE | Admit: 2017-08-27 | Discharge: 2017-08-27 | Disposition: A | Discharge status: Home / Self Care | Payer: Medicare Other | Source: Ambulatory Visit | Attending: General Surgery | Admitting: General Surgery

## 2017-08-27 ENCOUNTER — Other Ambulatory Visit
Admission: RE | Admit: 2017-08-27 | Discharge: 2017-08-27 | Disposition: A | Discharge status: Home / Self Care | Payer: Medicare Other | Source: Ambulatory Visit | Attending: General Surgery | Admitting: General Surgery

## 2017-08-27 ENCOUNTER — Other Ambulatory Visit: Payer: Self-pay

## 2017-08-27 DIAGNOSIS — Z1231 Encounter for screening mammogram for malignant neoplasm of breast: Secondary | ICD-10-CM

## 2017-08-27 DIAGNOSIS — R160 Hepatomegaly, not elsewhere classified: Secondary | ICD-10-CM | POA: Insufficient documentation

## 2017-08-27 DIAGNOSIS — C229 Malignant neoplasm of liver, not specified as primary or secondary: Secondary | ICD-10-CM

## 2017-08-27 DIAGNOSIS — C220 Liver cell carcinoma: Secondary | ICD-10-CM | POA: Insufficient documentation

## 2017-08-27 DIAGNOSIS — Z853 Personal history of malignant neoplasm of breast: Secondary | ICD-10-CM | POA: Diagnosis not present

## 2017-08-27 HISTORY — DX: Malignant neoplasm of unspecified site of unspecified female breast: C50.919

## 2017-08-27 LAB — CBC WITH DIFFERENTIAL/PLATELET
Basophils Absolute: 0.1 10*3/uL (ref 0–0.1)
Basophils Relative: 1 %
EOS PCT: 0 %
Eosinophils Absolute: 0 10*3/uL (ref 0–0.7)
HEMATOCRIT: 35.3 % (ref 35.0–47.0)
Hemoglobin: 11.9 g/dL — ABNORMAL LOW (ref 12.0–16.0)
LYMPHS ABS: 2.2 10*3/uL (ref 1.0–3.6)
LYMPHS PCT: 24 %
MCH: 30.5 pg (ref 26.0–34.0)
MCHC: 33.6 g/dL (ref 32.0–36.0)
MCV: 90.8 fL (ref 80.0–100.0)
MONO ABS: 0.7 10*3/uL (ref 0.2–0.9)
Monocytes Relative: 8 %
Neutro Abs: 6.1 10*3/uL (ref 1.4–6.5)
Neutrophils Relative %: 67 %
PLATELETS: 350 10*3/uL (ref 150–440)
RBC: 3.89 MIL/uL (ref 3.80–5.20)
RDW: 16.3 % — ABNORMAL HIGH (ref 11.5–14.5)
WBC: 9.2 10*3/uL (ref 3.6–11.0)

## 2017-08-27 LAB — HEPATIC FUNCTION PANEL
ALT: 65 U/L — AB (ref 14–54)
AST: 113 U/L — ABNORMAL HIGH (ref 15–41)
Albumin: 3.1 g/dL — ABNORMAL LOW (ref 3.5–5.0)
Alkaline Phosphatase: 541 U/L — ABNORMAL HIGH (ref 38–126)
BILIRUBIN DIRECT: 0.8 mg/dL — AB (ref 0.1–0.5)
BILIRUBIN INDIRECT: 1 mg/dL — AB (ref 0.3–0.9)
Total Bilirubin: 1.8 mg/dL — ABNORMAL HIGH (ref 0.3–1.2)
Total Protein: 7.4 g/dL (ref 6.5–8.1)

## 2017-08-27 LAB — APTT: aPTT: 33 seconds (ref 24–36)

## 2017-08-27 LAB — PROTIME-INR
INR: 0.91
Prothrombin Time: 12.2 seconds (ref 11.4–15.2)

## 2017-08-27 LAB — GAMMA GT: GGT: 719 U/L — ABNORMAL HIGH (ref 7–50)

## 2017-08-27 LAB — LACTATE DEHYDROGENASE: LDH: 275 U/L — ABNORMAL HIGH (ref 98–192)

## 2017-08-28 ENCOUNTER — Other Ambulatory Visit: Payer: Self-pay

## 2017-08-28 DIAGNOSIS — C220 Liver cell carcinoma: Secondary | ICD-10-CM

## 2017-08-28 DIAGNOSIS — R160 Hepatomegaly, not elsewhere classified: Secondary | ICD-10-CM

## 2017-08-28 LAB — CANCER ANTIGEN 15-3: CAN 15 3: 178.6 U/mL — AB (ref 0.0–25.0)

## 2017-08-28 LAB — CANCER ANTIGEN 27.29: CA 27.29: 226.1 U/mL — ABNORMAL HIGH (ref 0.0–38.6)

## 2017-08-28 LAB — AFP TUMOR MARKER: AFP, SERUM, TUMOR MARKER: 3.8 ng/mL (ref 0.0–8.3)

## 2017-08-28 LAB — CEA: CEA: 1926 ng/mL — ABNORMAL HIGH (ref 0.0–4.7)

## 2017-09-04 ENCOUNTER — Encounter: Payer: Self-pay | Admitting: Hematology and Oncology

## 2017-09-04 ENCOUNTER — Inpatient Hospital Stay: Payer: Medicare Other | Attending: Hematology and Oncology | Admitting: Hematology and Oncology

## 2017-09-04 VITALS — BP 97/63 | HR 94 | Temp 96.6°F | Resp 18 | Ht 62.0 in | Wt 105.0 lb

## 2017-09-04 DIAGNOSIS — R600 Localized edema: Secondary | ICD-10-CM | POA: Insufficient documentation

## 2017-09-04 DIAGNOSIS — R160 Hepatomegaly, not elsewhere classified: Secondary | ICD-10-CM

## 2017-09-04 DIAGNOSIS — R531 Weakness: Secondary | ICD-10-CM | POA: Diagnosis not present

## 2017-09-04 DIAGNOSIS — Z66 Do not resuscitate: Secondary | ICD-10-CM | POA: Diagnosis not present

## 2017-09-04 DIAGNOSIS — Z803 Family history of malignant neoplasm of breast: Secondary | ICD-10-CM | POA: Diagnosis not present

## 2017-09-04 DIAGNOSIS — Z8041 Family history of malignant neoplasm of ovary: Secondary | ICD-10-CM | POA: Insufficient documentation

## 2017-09-04 DIAGNOSIS — G809 Cerebral palsy, unspecified: Secondary | ICD-10-CM

## 2017-09-04 DIAGNOSIS — R5383 Other fatigue: Secondary | ICD-10-CM | POA: Insufficient documentation

## 2017-09-04 DIAGNOSIS — R5381 Other malaise: Secondary | ICD-10-CM | POA: Insufficient documentation

## 2017-09-04 DIAGNOSIS — Z923 Personal history of irradiation: Secondary | ICD-10-CM | POA: Insufficient documentation

## 2017-09-04 DIAGNOSIS — Z800 Family history of malignant neoplasm of digestive organs: Secondary | ICD-10-CM | POA: Insufficient documentation

## 2017-09-04 DIAGNOSIS — J069 Acute upper respiratory infection, unspecified: Secondary | ICD-10-CM | POA: Diagnosis not present

## 2017-09-04 DIAGNOSIS — Z9223 Personal history of estrogen therapy: Secondary | ICD-10-CM | POA: Diagnosis not present

## 2017-09-04 DIAGNOSIS — Z853 Personal history of malignant neoplasm of breast: Secondary | ICD-10-CM | POA: Diagnosis not present

## 2017-09-04 DIAGNOSIS — Z79899 Other long term (current) drug therapy: Secondary | ICD-10-CM

## 2017-09-04 DIAGNOSIS — K769 Liver disease, unspecified: Secondary | ICD-10-CM | POA: Diagnosis not present

## 2017-09-04 DIAGNOSIS — Z9181 History of falling: Secondary | ICD-10-CM | POA: Diagnosis not present

## 2017-09-04 DIAGNOSIS — Z7189 Other specified counseling: Secondary | ICD-10-CM | POA: Insufficient documentation

## 2017-09-04 DIAGNOSIS — C50411 Malignant neoplasm of upper-outer quadrant of right female breast: Secondary | ICD-10-CM

## 2017-09-04 NOTE — Progress Notes (Signed)
Met with Suzanne Jordan before consult with Dr. Mike Gip along with breast navigator Al Pimple. Reintroduced nurse navigator services. Provided contact information for any future needs/barriers to care. Oncology Nurse Navigator Documentation  Navigator Location: [facility] (09/04/17 1200)   )Navigator Encounter Type: Initial MedOnc (09/04/17 1200)                     Patient Visit Type: MedOnc;Initial (09/04/17 1200) Treatment Phase: Abnormal Scans (09/04/17 1200) Barriers/Navigation Needs: Transportation (09/04/17 1200)                Acuity: Level 2 (09/04/17 1200)   Acuity Level 2: Initial guidance, education and coordination as needed;Educational needs;Assistance expediting appointments;Ongoing guidance and education throughout treatment as needed (09/04/17 1200)     Time Spent with Patient: 45 (09/04/17 1200)

## 2017-09-04 NOTE — Progress Notes (Signed)
[facility] day:  09/04/2017  Chief Complaint: Suzanne Jordan is a 72 y.o. female with a history of stage I right breast cancer and recently diagnosed liver masses who is referred in consultation by Dr Bary Castilla.  HPI: The patient underwent right breast wide excision with sentinel lymph node biopsy on 11/16/2008 by Dr. Bary Castilla.  Pathology revealed a 1.5 cm grade II invasive carcinoma of the breast.  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  There was no lymph-vascular invasion.  Four sentinel lymph nodes were negative.  Tumor was ER + (> 90%), PR + (20%), and Her2/neu 2+ (FISH -). Pathologic stage was T1cN0. She underwent radiation treatment in 10 fractions. She completed 5 years of endocrine therapy (Femara).   The patient had normal LFTs on 03/19/2016.  Routine LFTs on 03/23/2017 revealed an AST 62, ALT 44, bilirubin 0.4, and alkaline phosphatase 266.  Follow-up labs on 05/19/2017 revealed an AST 81, ALT 42, bilirubin 0.7, and alkaline phosphatase 356.  Abdominal ultrasound on 05/26/2017 revealed multiple abnormal masses within the liver.  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  Hepatic MRI was recommended.  Liver MRI on 07/09/2017 was severely degraded, nearly non-diagnostic.  The liver was nearly replaced with solid lesions which are favored to represent metastasis.  No primary was seen in the abdomen.  Cirrhosis with multi-focal hepatocellular carcinoma was felt less likely.  Consider ultrasound guided biopsy.  There was an equivocal lesion with the right side of T12.  She was seen by Dr. Bary Castilla on 08/25/2017.  She denied any abdominal pain or change in bowel function.  Baseline labs were drawn.  Labs on 08/27/2017 revealed an AST 113, ALT 65, bilirubin 1.8, and alkaline phosphatase 541.  GGT was 719.  CBC revealed a hematocrit of 35.3, hemoglobin 11.9, MCV 90.8, platelets 350,000, WBC 9200 with a normal differential.  INR was 0.91.  LDH was 275.  AFP was 3.8.   CA15-3 was 178.6.  CA27.29 was 226.1.  CEA was 1926.  Bilateral screening mammogram on 08/27/2017 was negative.  She has never had a colonoscopy citing that she has "felt so good that she didn't see the point".   Symptomatically, she is fatigued. She is recovering for a URI that had her in the bed for the last 3 weeks. Cough has resolved. She denies fevers. She is s/p a recent fall. She noted that she slipped and fell in her bathroom. She is having a "safe bathroom" installed soon. Patient has generalized weakness and muscle spasticity related to her cerebral palsy diagnosis. She has a Baclofen pump in place.   She is eating well. Her weight is at a baseline of 105 pounds. Patient able to completed all of her ADLs independently. Patient scheduled to follow up with surgery (Dr Bary Castilla) on 09/08/2017.  Patient denies familial history that is significant for any type of oncologic or hematologic disorders.    Past Medical History:  Diagnosis Date  . Breast cancer ([place]) 2010   lumpectomy  . Cancer ([place]) 2010   T1c, N0, ER/ PR +. 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   . Cerebral palsy ([place])    at birth    Past Surgical History:  Procedure Laterality Date  . ABDOMINAL HYSTERECTOMY  1970's  . BREAST LUMPECTOMY Right 2010  . BREAST SURGERY Right 2010   wide excision with sn biopsy  . CATARACT EXTRACTION    . FRACTURE SURGERY Right    right leg, as a child  .  TUBAL LIGATION      Family History  Problem Relation Age of Onset  . Stroke Father        passed Jan 2014 age 80  . Cancer Other        colon,breast,ovarian cancers, relationships not listed  . Breast cancer Neg Hx     Social History:  reports that she has never smoked. She has never used smokeless tobacco. She reports that she does not drink alcohol or use drugs. Patient lives alone in [place]. Son stays with her when he is in town. The patient is alone today.  Allergies: No Known Allergies  Current Medications: Current  Outpatient Medications  Medication Sig Dispense Refill  . benazepril (LOTENSIN) 40 MG tablet Take 1 tablet (40 mg total) by mouth daily. 30 tablet 12  . diazepam (VALIUM) 10 MG tablet Take 1 tablet (10 mg total) by mouth every 12 (twelve) hours as needed for anxiety. 60 tablet 1  . fluticasone (FLONASE) 50 MCG/ACT nasal spray Place 2 sprays into both nostrils daily. 16 g 12  . guaiFENesin (MUCINEX) 600 MG 12 hr tablet Take 1 tablet (600 mg total) by mouth 2 (two) times daily as needed. 30 tablet 3  . hydrochlorothiazide (MICROZIDE) 12.5 MG capsule Take 1 capsule (12.5 mg total) by mouth daily. 30 capsule 12  . Multiple Vitamin (MULTIVITAMIN) capsule Take 1 capsule by mouth daily.    . verapamil (CALAN-SR) 240 MG CR tablet TAKE ONE (1) TABLET BY MOUTH EVERY DAY 30 tablet 12   No current facility-administered medications for this visit.    Review of Systems  Constitutional: Positive for malaise/fatigue. Negative for diaphoresis, fever and weight loss.  HENT: Negative.   Eyes: Negative.   Respiratory: Negative for cough, hemoptysis, sputum production and shortness of breath.   Cardiovascular: Positive for leg swelling (bilateral ankles; R>L). Negative for chest pain, palpitations, orthopnea and PND.  Gastrointestinal: Negative for abdominal pain, blood in stool, constipation, diarrhea, melena, nausea and vomiting.  Genitourinary: Negative for dysuria, frequency, hematuria and urgency.  Musculoskeletal: Positive for falls and neck pain. Negative for back pain, joint pain and myalgias.       Muscle spasticity related to cerebral palsy diagnosis; uses Baclofen pump  Skin: Negative for itching and rash.  Neurological: Positive for weakness. Negative for dizziness, tremors and headaches.       Cerebral palsy; No use of right arm  Endo/Heme/Allergies: Does not bruise/bleed easily.  Psychiatric/Behavioral: Negative for depression, memory loss and suicidal ideas. The patient is not nervous/anxious and  does not have insomnia.   All other systems reviewed and are negative.  Physical Exam: Blood pressure 97/63, pulse 94, temperature (!) 96.6 F (35.9 C), temperature source Tympanic, resp. rate 18, height 5' 2" (1.575 m), weight 105 lb (47.6 kg). GENERAL:  Thin woman sitting comfortably in a wheelchair in the exam room in no acute distress. MENTAL STATUS:  Alert and oriented to person, place and time. HEAD:  Short gray hair.  Normocephalic, atraumatic, face symmetric, no Cushingoid features. EYES:  Glasses.  Blue eyes.  Pupils equal round and reactive to light and accomodation.  No conjunctivitis or scleral icterus. ENT:  Oropharynx clear without lesion.  Tongue normal. Mucous membranes moist.  RESPIRATORY:  Clear to auscultation without rales, wheezes or rhonchi. CARDIOVASCULAR:  Regular rate and rhythm without murmur, rub or gallop. BREAST:  Right breast with post-operative changes at 10-12 o'clock.  Telangiectasias.  No nipple discharge.  Left breast without masses, skin changes or nipple  discharge.  ABDOMEN:  Soft, non-tender, with active bowel sounds, and no splenomegaly.  Liver palpable.  No masses. SKIN:  Baclofen pump in right lower abdomen.  No rashes, ulcers or lesions. EXTREMITIES: Right arm flexed.  Bilateral ankle edema (left > right). No skin discoloration or tenderness.  No palpable cords. LYMPH NODES: No palpable cervical, supraclavicular, axillary or inguinal adenopathy  NEUROLOGICAL: Cerebral palsy. PSYCH:  Appropriate.   No visits with results within 3 Day(s) from this visit.  Latest known visit with results is:  Hospital Outpatient Visit on 08/27/2017  Component Date Value Ref Range Status  . Prothrombin Time 08/27/2017 12.2  11.4 - 15.2 seconds Final  . INR 08/27/2017 0.91   Final   Performed at [facility], [address] [place], [place] [address]  . aPTT 08/27/2017 33  24 - 36 seconds Final   Performed at [facility], [address]  [place], [place] [address]  . WBC 08/27/2017 9.2  3.6 - 11.0 K/uL Final  . RBC 08/27/2017 3.89  3.80 - 5.20 MIL/uL Final  . Hemoglobin 08/27/2017 11.9* 12.0 - 16.0 g/dL Final  . HCT 08/27/2017 35.3  35.0 - 47.0 % Final  . MCV 08/27/2017 90.8  80.0 - 100.0 fL Final  . MCH 08/27/2017 30.5  26.0 - 34.0 pg Final  . MCHC 08/27/2017 33.6  32.0 - 36.0 g/dL Final  . RDW 08/27/2017 16.3* 11.5 - 14.5 % Final  . Platelets 08/27/2017 350  150 - 440 K/uL Final  . Neutrophils Relative % 08/27/2017 67  % Final  . Neutro Abs 08/27/2017 6.1  1.4 - 6.5 K/uL Final  . Lymphocytes Relative 08/27/2017 24  % Final  . Lymphs Abs 08/27/2017 2.2  1.0 - 3.6 K/uL Final  . Monocytes Relative 08/27/2017 8  % Final  . Monocytes Absolute 08/27/2017 0.7  0.2 - 0.9 K/uL Final  . Eosinophils Relative 08/27/2017 0  % Final  . Eosinophils Absolute 08/27/2017 0.0  0 - 0.7 K/uL Final  . Basophils Relative 08/27/2017 1  % Final  . Basophils Absolute 08/27/2017 0.1  0 - 0.1 K/uL Final   Performed at [facility], [address], [place], [place] [address]  . Total Protein 08/27/2017 7.4  6.5 - 8.1 g/dL Final  . Albumin 08/27/2017 3.1* 3.5 - 5.0 g/dL Final  . AST 08/27/2017 113* 15 - 41 U/L Final  . ALT 08/27/2017 65* 14 - 54 U/L Final  . Alkaline Phosphatase 08/27/2017 541* 38 - 126 U/L Final  . Total Bilirubin 08/27/2017 1.8* 0.3 - 1.2 mg/dL Final  . Bilirubin, Direct 08/27/2017 0.8* 0.1 - 0.5 mg/dL Final  . Indirect Bilirubin 08/27/2017 1.0* 0.3 - 0.9 mg/dL Final   Performed at [facility], [address], [place], [place] [address]  . AFP, Serum, Tumor Marker 08/27/2017 3.8  0.0 - 8.3 ng/mL Final   Comment: (NOTE) Roche Diagnostics Electrochemiluminescence Immunoassay (ECLIA) Values obtained with different assay methods or kits cannot be used interchangeably.  Results cannot be interpreted as absolute evidence of the presence or absence of malignant disease. This test is not interpretable in pregnant  females. Performed At: [facility] [place], [place] [id number] Rush Farmer MD [id number] Performed at [facility], [address] [place], [place] [address]   . GGT 08/27/2017 719* 7 - 50 U/L Final   Performed at [facility], [place] [address] [place], [place] [address]  . LDH 08/27/2017 275* 98 - 192  U/L Final   Performed at [facility], [address] [place], [place] [address]  . CA 15-3 08/27/2017 178.6* 0.0 - 25.0 U/mL Final   Comment: (NOTE) Roche Diagnostics Electrochemiluminescence Immunoassay (ECLIA) Values obtained with different assay methods or kits cannot be used interchangeably.  Results cannot be interpreted as absolute evidence of the presence or absence of malignant disease. Performed At: [facility] [place], [place] [id number] Rush Farmer MD [id number] Performed at [facility], [address] [place], [place] [address]   . CA 27.29 08/27/2017 226.1* 0.0 - 38.6 U/mL Final   Comment: (NOTE) Siemens Centaur Immunochemiluminometric Methodology [facility]) Values obtained with different assay methods or kits cannot be used interchangeably. Results cannot be interpreted as absolute evidence of the presence or absence of malignant disease. Performed At: [facility] [place], [place] [id number] Rush Farmer MD [id number] Performed at [facility], [address] [place], [place] [address]   . CEA 08/27/2017 1,926.0* 0.0 - 4.7 ng/mL Final   Comment: (NOTE) Results confirmed on dilution.                             Nonsmokers          <3.9                             Smokers             <5.6 Roche Diagnostics Electrochemiluminescence Immunoassay (ECLIA) Values obtained with different assay methods or kits cannot be used interchangeably.  Results cannot be interpreted as absolute evidence of the presence or absence of malignant  disease. Performed At: [facility] [place], [place] [id number] Rush Farmer MD [id number] Performed at [facility], [address] [place],  [address]     Assessment:  Suzanne Jordan is a 72 y.o. female with a history of stage I right breast cancer and imaging studies revealing liver lesions. She presented with increasing liver function tests.  She is right breast wide excision with sentinel lymph node biopsy on 11/16/2008.  Pathology revealed a 1.5 cm grade II invasive carcinoma of the breast.  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  There was no lymph-vascular invasion.  Four sentinel lymph nodes were negative.  Tumor was ER + (> 90%), PR + (20%), and Her2/neu 2+ (FISH -). Pathologic stage was T1cN0.    Bilateral screening mammogram on 08/27/2017 was negative.  Oncotype DX was 16 (low risk).  She received radiation and 5 years of Femara.   Liver MRI on 07/09/2017 was severely degraded, nearly non-diagnostic.  The liver was nearly replaced with solid lesions which are favored to represent metastasis.  No primary was seen in the abdomen.  There was an equivocal lesion with the right side of T12.  Tumor markers included:  AFP (3.8), CA15-3 (178.6), CA27.29 (226.1) and CEA (1926).  She has never had a colonoscopy.  She has cerebral palsy.  Performance status is 2.  Code status is DNR/DNI.  Symptomatically, she denies any new symptoms from baseline.  Exam reveals a palpable liver.  LFTs are elevated.  Plan: 1.  Discuss history of breast cancer.  Doubt current findings are related to breast cancer.  Discuss need for biopsy. 2.  Discuss findings on recent MRI.  Images personally reviewed and reviewed with patient.  Concern for metastatic disease, suspect GI  primary.  3.  Discuss need for additional imaging. MRI was a motion degraded study. PET would be ideal, however concerned about patient's ability to remain still due to muscle spasticity related to her  CP. Will discuss PET vs. CT with radiologist.  Contrast is a  concern due to patient's CrCl.  4.  Discuss need for biopsy. Additional imaging needed to determine safest biopsy location. PET scan would be ideal in determining alternate biopsy to liver if increased FDG uptake is observed in other locations.  5.  Discuss patient's thoughts about treatment for metastatic disease.  Treatment would be palliative.  Patient wishes to pursue treatment. 6.  Discuss code status. Patient has a living will in place. She defines her code status today as DNR/DNI.  7.  Follow up with Dr. Bary Castilla on 09/08/2017 as already scheduled.  8.  RTC - will plan on calling patient with an appointment once imaging plan and biopsy discussed with radiology.  Discuss with Dr. Bary Castilla.    Honor Loh, NP  09/04/2017, 1:02 PM   I saw and evaluated the patient, participating in the key portions of the service and reviewing pertinent diagnostic studies and records.  I reviewed the nurse practitioner's note and agree with the findings and the plan.  The assessment and plan were discussed with the patient.  Multiple questions were asked by the patient and answered.   Nolon Stalls, MD 09/04/2017, 1:02 PM

## 2017-09-04 NOTE — Progress Notes (Signed)
Patient here today as new evaluation regarding hepatocellular mass.  Referred by Dr. Bary Castilla.  Patient has cerebral palsey.  She has a history of right breast cancer in 2009.  Had 10 radiation treatments.  She presents to clinic today alone,  in a wheelchair.  Patient states she fell this week. Her right lower extremity is extremely edematous.

## 2017-09-06 ENCOUNTER — Encounter: Payer: Self-pay | Admitting: Hematology and Oncology

## 2017-09-08 ENCOUNTER — Ambulatory Visit (INDEPENDENT_AMBULATORY_CARE_PROVIDER_SITE_OTHER): Payer: Medicare Other | Admitting: General Surgery

## 2017-09-08 ENCOUNTER — Encounter: Payer: Self-pay | Admitting: General Surgery

## 2017-09-08 VITALS — BP 110/62 | HR 68 | Ht 62.0 in | Wt 105.0 lb

## 2017-09-08 DIAGNOSIS — Z853 Personal history of malignant neoplasm of breast: Secondary | ICD-10-CM

## 2017-09-08 NOTE — Patient Instructions (Signed)
Follow up per Dr Mike Gip

## 2017-09-08 NOTE — Progress Notes (Signed)
Patient ID: Suzanne Jordan, female   DOB: [birth year], 72 y.o.   MRN: [id number]  Chief Complaint  Patient presents with  . Follow-up    HPI Suzanne Jordan is a 72 y.o. female who presents for a breast evaluation. The most recent mammogram was done on 08/27/2017. Patient does perform regular self breast checks and gets regular mammograms done.   She reports that she fell a little over a week ago.   HPI  Past Medical History:  Diagnosis Date  . Breast cancer ([place]) 2010   lumpectomy  . Cancer ([place]) 2010   T1c, N0, ER/ PR +. 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   . Cerebral palsy ([place])    at birth    Past Surgical History:  Procedure Laterality Date  . ABDOMINAL HYSTERECTOMY  1970's  . BREAST LUMPECTOMY Right 2010  . BREAST SURGERY Right 2010   wide excision with sn biopsy  . CATARACT EXTRACTION    . FRACTURE SURGERY Right    right leg, as a child  . TUBAL LIGATION      Family History  Problem Relation Age of Onset  . Stroke Father        passed Jan 2014 age 63  . Cancer Other        colon,breast,ovarian cancers, relationships not listed  . Breast cancer Neg Hx     Social History Social History   Tobacco Use  . Smoking status: Never Smoker  . Smokeless tobacco: Never Used  Substance Use Topics  . Alcohol use: No  . Drug use: No    No Known Allergies  Current Outpatient Medications  Medication Sig Dispense Refill  . benazepril (LOTENSIN) 40 MG tablet Take 1 tablet (40 mg total) by mouth daily. 30 tablet 12  . diazepam (VALIUM) 10 MG tablet Take 1 tablet (10 mg total) by mouth every 12 (twelve) hours as needed for anxiety. 60 tablet 1  . fluticasone (FLONASE) 50 MCG/ACT nasal spray Place 2 sprays into both nostrils daily. 16 g 12  . guaiFENesin (MUCINEX) 600 MG 12 hr tablet Take 1 tablet (600 mg total) by mouth 2 (two) times daily as needed. 30 tablet 3  . hydrochlorothiazide (MICROZIDE) 12.5 MG capsule Take 1 capsule (12.5 mg total) by mouth daily. 30 capsule 12  .  Multiple Vitamin (MULTIVITAMIN) capsule Take 1 capsule by mouth daily.    . verapamil (CALAN-SR) 240 MG CR tablet TAKE ONE (1) TABLET BY MOUTH EVERY DAY 30 tablet 12   No current facility-administered medications for this visit.     Review of Systems Review of Systems  Blood pressure 110/62, pulse 68, height 5\' 2"  (1.575 m), weight 105 lb (47.6 kg), head circumference 12" (30.5 cm).  Physical Exam Physical Exam  Constitutional: She is oriented to person, place, and time. She appears well-developed and well-nourished.  Eyes: Conjunctivae are normal. No scleral icterus.  Neck: Neck supple.  Cardiovascular: Normal rate, regular rhythm and normal heart sounds.  Pulmonary/Chest: Effort normal and breath sounds normal. Right breast exhibits no inverted nipple, no mass, no nipple discharge, no skin change and no tenderness. Left breast exhibits no inverted nipple, no mass, no nipple discharge, no skin change and no tenderness.    Lymphadenopathy:    She has no cervical adenopathy.    She has no axillary adenopathy.  Neurological: She is alert and oriented to person, place, and time.  Skin: Skin is warm and dry.  Psychiatric: She has a normal mood and affect.  Data Reviewed Bilateral screening mammograms of Aug 27, 2017 reviewed.  BI-RADS-2.  Medical oncology consultation of Sep 04, 2017 reviewed.  Assessment    Unremarkable breast exam.  Metastatic liver disease, biopsy pending.    Plan    Follow up to be announced     HPI, Physical Exam, Assessment and Plan have been scribed under the direction and in the presence of Robert Bellow, MD  [name], LPN  I have completed the exam and reviewed the above documentation for accuracy and completeness.  I agree with the above.  Haematologist has been used and any errors in dictation or transcription are unintentional.  Hervey Ard, M.D., F.A.C.S.  Forest Gleason Anselm Aumiller 09/08/2017, 8:28 PM

## 2017-09-09 ENCOUNTER — Other Ambulatory Visit: Payer: Self-pay | Admitting: Urgent Care

## 2017-09-09 DIAGNOSIS — R160 Hepatomegaly, not elsewhere classified: Secondary | ICD-10-CM

## 2017-09-18 ENCOUNTER — Other Ambulatory Visit: Payer: Self-pay | Admitting: Student

## 2017-09-19 ENCOUNTER — Emergency Department: Payer: Medicare Other

## 2017-09-19 ENCOUNTER — Inpatient Hospital Stay
Admission: EM | Admit: 2017-09-19 | Discharge: 2017-09-21 | DRG: 684 | Disposition: A | Discharge status: Home Health | Payer: Medicare Other | Attending: Internal Medicine | Admitting: Internal Medicine

## 2017-09-19 ENCOUNTER — Other Ambulatory Visit: Payer: Self-pay

## 2017-09-19 DIAGNOSIS — L89151 Pressure ulcer of sacral region, stage 1: Secondary | ICD-10-CM | POA: Diagnosis present

## 2017-09-19 DIAGNOSIS — R531 Weakness: Secondary | ICD-10-CM | POA: Diagnosis not present

## 2017-09-19 DIAGNOSIS — G809 Cerebral palsy, unspecified: Secondary | ICD-10-CM | POA: Diagnosis present

## 2017-09-19 DIAGNOSIS — R55 Syncope and collapse: Secondary | ICD-10-CM | POA: Diagnosis present

## 2017-09-19 DIAGNOSIS — I10 Essential (primary) hypertension: Secondary | ICD-10-CM | POA: Diagnosis present

## 2017-09-19 DIAGNOSIS — R4020 Unspecified coma: Secondary | ICD-10-CM | POA: Diagnosis not present

## 2017-09-19 DIAGNOSIS — Z79899 Other long term (current) drug therapy: Secondary | ICD-10-CM | POA: Diagnosis not present

## 2017-09-19 DIAGNOSIS — Z7401 Bed confinement status: Secondary | ICD-10-CM | POA: Diagnosis not present

## 2017-09-19 DIAGNOSIS — Z853 Personal history of malignant neoplasm of breast: Secondary | ICD-10-CM | POA: Diagnosis not present

## 2017-09-19 DIAGNOSIS — W19XXXA Unspecified fall, initial encounter: Secondary | ICD-10-CM | POA: Diagnosis not present

## 2017-09-19 DIAGNOSIS — I6523 Occlusion and stenosis of bilateral carotid arteries: Secondary | ICD-10-CM | POA: Diagnosis not present

## 2017-09-19 DIAGNOSIS — N179 Acute kidney failure, unspecified: Principal | ICD-10-CM | POA: Diagnosis present

## 2017-09-19 DIAGNOSIS — R410 Disorientation, unspecified: Secondary | ICD-10-CM | POA: Diagnosis not present

## 2017-09-19 DIAGNOSIS — E860 Dehydration: Secondary | ICD-10-CM | POA: Diagnosis present

## 2017-09-19 LAB — CBC WITH DIFFERENTIAL/PLATELET
BASOS ABS: 0.1 10*3/uL (ref 0–0.1)
BASOS PCT: 1 %
EOS ABS: 0 10*3/uL (ref 0–0.7)
Eosinophils Relative: 0 %
HCT: 35.1 % (ref 35.0–47.0)
Hemoglobin: 11.9 g/dL — ABNORMAL LOW (ref 12.0–16.0)
Lymphocytes Relative: 25 %
Lymphs Abs: 2.4 10*3/uL (ref 1.0–3.6)
MCH: 31.5 pg (ref 26.0–34.0)
MCHC: 33.9 g/dL (ref 32.0–36.0)
MCV: 93.1 fL (ref 80.0–100.0)
Monocytes Absolute: 1 10*3/uL — ABNORMAL HIGH (ref 0.2–0.9)
Monocytes Relative: 10 %
NEUTROS PCT: 64 %
Neutro Abs: 6 10*3/uL (ref 1.4–6.5)
PLATELETS: 351 10*3/uL (ref 150–440)
RBC: 3.78 MIL/uL — AB (ref 3.80–5.20)
RDW: 16.6 % — ABNORMAL HIGH (ref 11.5–14.5)
WBC: 9.4 10*3/uL (ref 3.6–11.0)

## 2017-09-19 LAB — URINALYSIS, COMPLETE (UACMP) WITH MICROSCOPIC
BILIRUBIN URINE: NEGATIVE
GLUCOSE, UA: NEGATIVE mg/dL
HGB URINE DIPSTICK: NEGATIVE
Ketones, ur: NEGATIVE mg/dL
LEUKOCYTES UA: NEGATIVE
NITRITE: NEGATIVE
Protein, ur: NEGATIVE mg/dL
SQUAMOUS EPITHELIAL / LPF: NONE SEEN (ref 0–5)
Specific Gravity, Urine: 1.013 (ref 1.005–1.030)
pH: 5 (ref 5.0–8.0)

## 2017-09-19 LAB — COMPREHENSIVE METABOLIC PANEL
ALK PHOS: 431 U/L — AB (ref 38–126)
ALT: 47 U/L (ref 14–54)
ANION GAP: 9 (ref 5–15)
AST: 102 U/L — ABNORMAL HIGH (ref 15–41)
Albumin: 2.7 g/dL — ABNORMAL LOW (ref 3.5–5.0)
BUN: 46 mg/dL — ABNORMAL HIGH (ref 6–20)
CALCIUM: 9.3 mg/dL (ref 8.9–10.3)
CO2: 22 mmol/L (ref 22–32)
CREATININE: 2.01 mg/dL — AB (ref 0.44–1.00)
Chloride: 111 mmol/L (ref 101–111)
GFR, EST AFRICAN AMERICAN: 27 mL/min — AB (ref >60)
GFR, EST NON AFRICAN AMERICAN: 24 mL/min — AB (ref >60)
Glucose, Bld: 94 mg/dL (ref 65–99)
Potassium: 4.5 mmol/L (ref 3.5–5.1)
Sodium: 142 mmol/L (ref 135–145)
TOTAL PROTEIN: 6.7 g/dL (ref 6.5–8.1)
Total Bilirubin: 2.5 mg/dL — ABNORMAL HIGH (ref 0.3–1.2)

## 2017-09-19 LAB — TROPONIN I: Troponin I: <0.03 ng/mL (ref <0.03)

## 2017-09-19 LAB — CK: Total CK: 185 U/L (ref 38–234)

## 2017-09-19 LAB — ETHANOL

## 2017-09-19 MED ORDER — SODIUM CHLORIDE 0.9 % IV BOLUS
500.0000 mL | Freq: Once | INTRAVENOUS | Status: AC
  Administered 2017-09-19: 500 mL via INTRAVENOUS

## 2017-09-19 NOTE — ED Triage Notes (Signed)
Pt fell today, found by church friends. Pt believes she fell around 3pm - remembers eating fruit for lunch but not the actual fall

## 2017-09-19 NOTE — ED Notes (Signed)
Pt to CT at this time.

## 2017-09-19 NOTE — H&P (Signed)
[facility] NAME: Suzanne Jordan    MR#:  [id number]  DATE OF BIRTH:  [birth year]  DATE OF ADMISSION:  09/19/2017  PRIMARY CARE PHYSICIAN: Guadalupe Maple, MD   REQUESTING/REFERRING PHYSICIAN:   CHIEF COMPLAINT:   Chief Complaint  Patient presents with  . Fall    HISTORY OF PRESENT ILLNESS: Suzanne Jordan  is a 72 y.o. female with a known history of hypertension and cerebral palsy, mostly bedridden.  Patient was brought to emergency room status post a syncopal episode.  She was brought in by EMS after being found down by charge friends.  Patient does not recall the episode and is poor historian due to her medical comorbidities.  Most of the information was taken from reviewing the medical chart and from discussion with emergency room physician.  It is unclear the circumstances under which she found herself on the ground.  She currently feels better, somewhat tired, but denies any pain, no N/V/D, no fever or chills.  Patient just finished antibiotic course for an upper respiratory infection. Blood test done emergency room revealed elevated creatinine level at 2.01, baseline is 1.05.  Troponin level is negative. Chest x-ray and brain CAT scan, reviewed by myself, are negative for any acute changes.  EKG shows sinus rate at 110 bpm, normal axis; no acute ST elevation or depression. Patient is admitted for further evaluation and treatment.  PAST MEDICAL HISTORY:   Past Medical History:  Diagnosis Date  . Breast cancer ([place]) 2010   lumpectomy  . Cancer ([place]) 2010   T1c, N0, ER/ PR +. 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   . Cerebral palsy ([place])    at birth    PAST SURGICAL HISTORY:  Past Surgical History:  Procedure Laterality Date  . ABDOMINAL HYSTERECTOMY  1970's  . BREAST LUMPECTOMY Right 2010  . BREAST SURGERY Right 2010   wide excision with sn biopsy  . CATARACT EXTRACTION    . FRACTURE SURGERY Right    right leg, as a  child  . TUBAL LIGATION      SOCIAL HISTORY:  Social History   Tobacco Use  . Smoking status: Never Smoker  . Smokeless tobacco: Never Used  Substance Use Topics  . Alcohol use: No    FAMILY HISTORY:  Family History  Problem Relation Age of Onset  . Stroke Father        passed Jan 2014 age 46  . Cancer Other        colon,breast,ovarian cancers, relationships not listed  . Breast cancer Neg Hx     DRUG ALLERGIES: No Known Allergies  REVIEW OF SYSTEMS:   CONSTITUTIONAL: No fever, but patient complains of fatigue and generalized weakness.  EYES: No blurred or double vision.  EARS, NOSE, AND THROAT: No tinnitus or ear pain.  RESPIRATORY: No cough, shortness of breath, wheezing or hemoptysis.  CARDIOVASCULAR: No chest pain, orthopnea, edema.  GASTROINTESTINAL: No nausea, vomiting, diarrhea or abdominal pain.  GENITOURINARY: No dysuria, hematuria.  ENDOCRINE: No polyuria, nocturia,  HEMATOLOGY: No bleeding SKIN: Positive for stage I sacral ulcer. MUSCULOSKELETAL: Positive history of contractures on the right side.  Patient is mostly bedridden, secondary to cerebral palsy. NEUROLOGIC: No focal weakness.  PSYCHIATRY: No anxiety or depression.   MEDICATIONS AT HOME:  Prior to Admission medications   Medication Sig Start Date End Date Taking? Authorizing Provider  benazepril (LOTENSIN) 40 MG tablet Take 1 tablet (40 mg total) by mouth daily.  04/01/17  Yes Crissman, Jeannette How, MD  diazepam (VALIUM) 10 MG tablet Take 1 tablet (10 mg total) by mouth every 12 (twelve) hours as needed for anxiety. 10/26/14  Yes Crissman, Jeannette How, MD  fluticasone (FLONASE) 50 MCG/ACT nasal spray Place 2 sprays into both nostrils daily. 04/01/17  Yes Crissman, Jeannette How, MD  hydrochlorothiazide (MICROZIDE) 12.5 MG capsule Take 1 capsule (12.5 mg total) by mouth daily. 07/14/17  Yes Crissman, Jeannette How, MD  Multiple Vitamin (MULTIVITAMIN) capsule Take 1 capsule by mouth daily.   Yes [provider]   verapamil (CALAN-SR) 240 MG CR tablet TAKE ONE (1) TABLET BY MOUTH EVERY DAY 07/17/17  Yes Crissman, Jeannette How, MD  guaiFENesin (MUCINEX) 600 MG 12 hr tablet Take 1 tablet (600 mg total) by mouth 2 (two) times daily as needed. Patient not taking: Reported on 09/19/2017 08/03/17   Guadalupe Maple, MD      PHYSICAL EXAMINATION:   VITAL SIGNS: Blood pressure 107/68, pulse (!) 101, temperature 98.1 F (36.7 C), resp. rate 20, height 5\' 2"  (1.575 m), weight 47.6 kg (105 lb), SpO2 95 %.  GENERAL:  72 y.o.-year-old patient lying in the bed with no acute distress.  She looks very thin and chronically ill, but nontoxic. EYES: Pupils equal, round, reactive to light and accommodation. No scleral icterus. Extraocular muscles intact.  HEENT: Head atraumatic, normocephalic. Oropharynx and nasopharynx clear.  NECK:  Supple, no jugular venous distention. No thyroid enlargement, no tenderness.  LUNGS: Normal breath sounds bilaterally, no wheezing, rales,rhonchi or crepitation. No use of accessory muscles of respiration.  CARDIOVASCULAR: S1, S2 normal. No S3/S4.  ABDOMEN: Soft, nontender, nondistended. Bowel sounds present. No organomegaly or mass.  EXTREMITIES: No pedal edema, cyanosis, or clubbing.  NEUROLOGIC: Right upper extremity chronic contractures noted.  Scoliosis is noted as well.  No focal weakness PSYCHIATRIC: The patient is alert and oriented x 3.  SKIN: Stage I sacral ulcer noted.   LABORATORY PANEL:   CBC Recent Labs  Lab 09/19/17 1904  WBC 9.4  HGB 11.9*  HCT 35.1  PLT 351  MCV 93.1  MCH 31.5  MCHC 33.9  RDW 16.6*  LYMPHSABS 2.4  MONOABS 1.0*  EOSABS 0.0  BASOSABS 0.1   ------------------------------------------------------------------------------------------------------------------  Chemistries  Recent Labs  Lab 09/19/17 1904  NA 142  K 4.5  CL 111  CO2 22  GLUCOSE 94  BUN 46*  CREATININE 2.01*  CALCIUM 9.3  AST 102*  ALT 47  ALKPHOS 431*  BILITOT 2.5*    ------------------------------------------------------------------------------------------------------------------ estimated creatinine clearance is 19 mL/min (A) (by C-G formula based on SCr of 2.01 mg/dL (H)). ------------------------------------------------------------------------------------------------------------------ No results for input(s): TSH, T4TOTAL, T3FREE, THYROIDAB in the last 72 hours.  Invalid input(s): FREET3   Coagulation profile No results for input(s): INR, PROTIME in the last 168 hours. ------------------------------------------------------------------------------------------------------------------- No results for input(s): DDIMER in the last 72 hours. -------------------------------------------------------------------------------------------------------------------  Cardiac Enzymes Recent Labs  Lab 09/19/17 1904  TROPONINI <0.03   ------------------------------------------------------------------------------------------------------------------ Invalid input(s): POCBNP  ---------------------------------------------------------------------------------------------------------------  Urinalysis    Component Value Date/Time   COLORURINE YELLOW (A) 09/19/2017 1904   APPEARANCEUR CLEAR (A) 09/19/2017 1904   APPEARANCEUR Clear 03/23/2017 0940   LABSPEC 1.013 09/19/2017 1904   PHURINE 5.0 09/19/2017 1904   GLUCOSEU NEGATIVE 09/19/2017 1904   HGBUR NEGATIVE 09/19/2017 1904   BILIRUBINUR NEGATIVE 09/19/2017 1904   BILIRUBINUR Negative 03/23/2017 0940   KETONESUR NEGATIVE 09/19/2017 1904   PROTEINUR NEGATIVE 09/19/2017 1904   NITRITE NEGATIVE 09/19/2017 1904   LEUKOCYTESUR  NEGATIVE 09/19/2017 1904   LEUKOCYTESUR Trace (A) 03/23/2017 0940     RADIOLOGY: Ct Head Wo Contrast  Result Date: 09/19/2017 CLINICAL DATA:  Altered level of consciousness. EXAM: CT HEAD WITHOUT CONTRAST TECHNIQUE: Contiguous axial images were obtained from the base of the skull  through the vertex without intravenous contrast. COMPARISON:  None. FINDINGS: Brain: No evidence of acute infarction, hemorrhage, extra-axial collection, ventriculomegaly, or mass effect. 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 Old left basal ganglia lacunar infarcts. Generalized cerebral atrophy. Periventricular white matter low attenuation likely secondary to microangiopathy. Vascular: Cerebrovascular atherosclerotic calcifications are noted. Skull: Negative for fracture or focal lesion. Sinuses/Orbits: Visualized portions of the orbits are unremarkable. Visualized portions of the paranasal sinuses and mastoid air cells are unremarkable. Other: None. IMPRESSION: No acute intracranial pathology. Electronically Signed   By: Kathreen Devoid   On: 09/19/2017 19:55   Dg Chest Port 1 View  Result Date: 09/19/2017 CLINICAL DATA:  Weakness EXAM: PORTABLE CHEST 1 VIEW COMPARISON:  None. FINDINGS: Mild elevation of the right diaphragm. Mild scarring or atelectasis at the left base. No focal consolidation or significant effusion. Heart size upper normal. No pneumothorax. Probable skin fold artifact left lower lateral chest wall. Possible old fracture deformity proximal right humerus. IMPRESSION: No active disease. Mild elevation of the right diaphragm with atelectasis or scar at the left base. Electronically Signed   By: Donavan Foil M.D.   On: 09/19/2017 19:59    EKG: Orders placed or performed during the hospital encounter of 09/19/17  . EKG 12-Lead  . EKG 12-Lead    IMPRESSION AND PLAN:  1.  Syncope, unclear etiology.  It could have been related to acute renal failure.  Will rule out cardiac and neurologic causes.  Will check 2D echo and carotid ultrasound.  We will continue to monitor on telemetry and follow the troponin level.  We will start IV fluids and monitor patient clinically closely. 2.  Acute renal failure.  Creatinine is elevated at 2.01, while baseline in this patient is at 1.05.   This is likely prerenal, secondary to poor p.o. Intake.  We will start gentle IV hydration, will avoid diuretics.  Continue to monitor kidney function closely and avoid nephrotoxic medications. 3.  Stage I sacral pressure ulcer.  Will turn patient every 2 hours and follow wound care treatment. 4.  Cerebral palsy, mostly bedridden.  Continue home medications and supportive measures.  All the records are reviewed and case discussed with ED provider. Management plans discussed with the patient and she is in agreement.  CODE STATUS:    TOTAL TIME TAKING CARE OF THIS PATIENT: 45 minutes.    Amelia Jo M.D on 09/19/2017 at 11:01 PM  Between 7am to 6pm - Pager - [phone]  After 6pm go to [email or web address] - password [email or web address] [facility]  Office  [phone]  CC: Primary care physician; Guadalupe Maple, MD

## 2017-09-19 NOTE — ED Provider Notes (Signed)
[facility] Emergency Department Provider Note  ____________________________________________   I have reviewed the triage vital signs and the nursing notes. Where available I have reviewed prior notes and, if possible and indicated, outside hospital notes.    HISTORY  Chief Complaint Fall    HPI Suzanne Jordan is a 72 y.o. female who suffers from cerebral palsy, had a lumpectomy in the past, has hypertension she states, he is pretty limited in terms of what she can offer.  She was brought in by EMS after being found down by church friends.  Is unclear the circumstances under which she found herself on the ground.  She states the last thing she remember really was eating lunch around 3 she has no real recollection until the EMS and church people found her.  She denies any pain, she states that she had a URI for the last couple weeks but she is been on antibiotics and that is better or getting better.  She denies any fever or chills denies any nausea vomiting or headache or stiff neck, she denies any history of seizures she did not bite her tongue she does not feel that she is incontinent of bladder, she states that she feels fine at this time.  She is never blacked out or had anything like this happen her before.  She is not diabetic.  She did eat lunch.  Reported EMS glucose was within normal limits.  Patient has chronic contractures especially of the right side and this is not new.    Past Medical History:  Diagnosis Date  . Breast cancer ([place]) 2010   lumpectomy  . Cancer ([place]) 2010   T1c, N0, ER/ PR +. 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   . Cerebral palsy ([place])    at birth    Patient Active Problem List   Diagnosis Date Noted  . Goals of care, counseling/discussion 09/04/2017  . Liver masses 08/26/2017  . Advanced care planning/counseling discussion 04/01/2017  . History of breast cancer 07/17/2015  . Essential hypertension 01/24/2015  . Cerebral palsy ([place])  01/24/2015  . Malignant neoplasm of upper-outer quadrant of female breast [facility])     Past Surgical History:  Procedure Laterality Date  . ABDOMINAL HYSTERECTOMY  1970's  . BREAST LUMPECTOMY Right 2010  . BREAST SURGERY Right 2010   wide excision with sn biopsy  . CATARACT EXTRACTION    . FRACTURE SURGERY Right    right leg, as a child  . TUBAL LIGATION      Prior to Admission medications   Medication Sig Start Date End Date Taking? Authorizing Provider  benazepril (LOTENSIN) 40 MG tablet Take 1 tablet (40 mg total) by mouth daily. 04/01/17   Guadalupe Maple, MD  diazepam (VALIUM) 10 MG tablet Take 1 tablet (10 mg total) by mouth every 12 (twelve) hours as needed for anxiety. 10/26/14   Guadalupe Maple, MD  fluticasone (FLONASE) 50 MCG/ACT nasal spray Place 2 sprays into both nostrils daily. 04/01/17   Guadalupe Maple, MD  guaiFENesin (MUCINEX) 600 MG 12 hr tablet Take 1 tablet (600 mg total) by mouth 2 (two) times daily as needed. 08/03/17   Guadalupe Maple, MD  hydrochlorothiazide (MICROZIDE) 12.5 MG capsule Take 1 capsule (12.5 mg total) by mouth daily. 07/14/17   Guadalupe Maple, MD  Multiple Vitamin (MULTIVITAMIN) capsule Take 1 capsule by mouth daily.    [provider]  verapamil (CALAN-SR) 240 MG CR tablet TAKE ONE (1) TABLET BY  MOUTH EVERY DAY 07/17/17   Guadalupe Maple, MD    Allergies Patient has no known allergies.  Family History  Problem Relation Age of Onset  . Stroke Father        passed Jan 2014 age 22  . Cancer Other        colon,breast,ovarian cancers, relationships not listed  . Breast cancer Neg Hx     Social History Social History   Tobacco Use  . Smoking status: Never Smoker  . Smokeless tobacco: Never Used  Substance Use Topics  . Alcohol use: No  . Drug use: No    Review of Systems Constitutional: No fever/chills Eyes: No visual changes. ENT: No sore throat. No stiff neck no neck pain Cardiovascular: Denies chest pain. Respiratory:  Denies shortness of breath. Gastrointestinal:   no vomiting.  No diarrhea.  No constipation. Genitourinary: Negative for dysuria. Musculoskeletal: Negative lower extremity swelling Skin: Negative for rash. Neurological: Negative for severe headaches, focal weakness or numbness.   ____________________________________________   PHYSICAL EXAM:  VITAL SIGNS: ED Triage Vitals  Enc Vitals Group     BP 09/19/17 1901 106/62     Pulse Rate 09/19/17 1901 (!) 109     Resp 09/19/17 1901 18     Temp 09/19/17 1901 98.1 F (36.7 C)     Temp src --      SpO2 09/19/17 1901 97 %     Weight 09/19/17 1904 105 lb (47.6 kg)     Height 09/19/17 1904 5\' 2"  (1.575 m)     Head Circumference --      Peak Flow --      Pain Score 09/19/17 1902 0     Pain Loc --      Pain Edu? --      Excl. in [place]? --     Constitutional: Alert and oriented.  No acute distress at this time.  Chronically ill-appearing Eyes: Conjunctivae are normal Head: Atraumatic HEENT: No congestion/rhinnorhea. Mucous membranes are moist.  Oropharynx non-erythematous Neck:   Nontender with no meningismus, no masses, no stridor Cardiovascular: Tachycardia noted, regular rhythm. Grossly normal heart sounds.  Good peripheral circulation. Respiratory: Normal respiratory effort.  No retractions. Lungs CTAB. Abdominal: Soft and nontender. No distention. No guarding no rebound Back:  There is no focal tenderness or step off.  there is no midline tenderness there are no lesions noted. there is no CVA tenderness Musculoskeletal: No lower extremity tenderness, no upper extremity tenderness. No joint effusions, no DVT signs strong distal pulses no edema Neurologic:  Normal speech and language.   Contracture noted especially in the right upper extremity, scoliosis noted etc. however, patient is generally weak but no specific focal weakness is discernible given baseline findings Skin:  Skin is warm, dry and intact. No rash noted. Psychiatric: Mood  and affect are normal. Speech and behavior are normal.  ____________________________________________   LABS (all labs ordered are listed, but only abnormal results are displayed)  Labs Reviewed  URINALYSIS, COMPLETE (UACMP) WITH MICROSCOPIC  COMPREHENSIVE METABOLIC PANEL  CBC WITH DIFFERENTIAL/PLATELET  ETHANOL  TROPONIN I  CK    Pertinent labs  results that were available during my care of the patient were reviewed by me and considered in my medical decision making (see chart for details). ____________________________________________  EKG  I personally interpreted any EKGs ordered by me or triage Sinus rate 110, normal axis, no acute ST elevation or depression, nonspecific ST changes  ____________________________________________  RADIOLOGY  Pertinent labs & imaging  results that were available during my care of the patient were reviewed by me and considered in my medical decision making (see chart for details). If possible, patient and/or family made aware of any abnormal findings.  No results found. ____________________________________________    PROCEDURES  Procedure(s) performed: None  Procedures  Critical Care performed: None  ____________________________________________   INITIAL IMPRESSION / ASSESSMENT AND PLAN / ED COURSE  Pertinent labs & imaging results that were available during my care of the patient were reviewed by me and considered in my medical decision making (see chart for details).  Patient here after being found down after a fall of unknown length of time.  She has no clear recollection of how she got there.  We will treat this is a syncopal event.  Obtain CT scan of the head, chest x-ray for her cough, we will check total CK basic blood work urinalysis will give her IV fluid this is very high temperature is unclear exactly the condition which she was found, we will obtain cardiac enzymes, and we will continue to evaluate her here for what sounds  like a possible significant syncopal event    ____________________________________________   FINAL CLINICAL IMPRESSION(S) / ED DIAGNOSES  Final diagnoses:  Weakness      This chart was dictated using voice recognition software.  Despite best efforts to proofread,  errors can occur which can change meaning.      Schuyler Amor, MD 09/19/17 [phone]

## 2017-09-19 NOTE — ED Notes (Signed)
Transport patient to floor room 250.AS

## 2017-09-20 ENCOUNTER — Other Ambulatory Visit: Payer: Self-pay

## 2017-09-20 ENCOUNTER — Inpatient Hospital Stay: Payer: Medicare Other

## 2017-09-20 ENCOUNTER — Inpatient Hospital Stay
Admit: 2017-09-20 | Discharge: 2017-09-20 | Disposition: A | Discharge status: Home / Self Care | Payer: Medicare Other | Attending: Internal Medicine | Admitting: Internal Medicine

## 2017-09-20 LAB — GLUCOSE, CAPILLARY
GLUCOSE-CAPILLARY: 79 mg/dL (ref 65–99)
Glucose-Capillary: 73 mg/dL (ref 65–99)

## 2017-09-20 LAB — BASIC METABOLIC PANEL
Anion gap: 7 (ref 5–15)
BUN: 44 mg/dL — AB (ref 6–20)
CO2: 23 mmol/L (ref 22–32)
CREATININE: 1.77 mg/dL — AB (ref 0.44–1.00)
Calcium: 8.4 mg/dL — ABNORMAL LOW (ref 8.9–10.3)
Chloride: 114 mmol/L — ABNORMAL HIGH (ref 101–111)
GFR, EST AFRICAN AMERICAN: 32 mL/min — AB (ref >60)
GFR, EST NON AFRICAN AMERICAN: 28 mL/min — AB (ref >60)
Glucose, Bld: 88 mg/dL (ref 65–99)
Potassium: 4.2 mmol/L (ref 3.5–5.1)
Sodium: 144 mmol/L (ref 135–145)

## 2017-09-20 LAB — CBC
HCT: 30.7 % — ABNORMAL LOW (ref 35.0–47.0)
Hemoglobin: 10.7 g/dL — ABNORMAL LOW (ref 12.0–16.0)
MCH: 32.1 pg (ref 26.0–34.0)
MCHC: 35 g/dL (ref 32.0–36.0)
MCV: 91.8 fL (ref 80.0–100.0)
PLATELETS: 269 10*3/uL (ref 150–440)
RBC: 3.35 MIL/uL — AB (ref 3.80–5.20)
RDW: 16.5 % — AB (ref 11.5–14.5)
WBC: 6.1 10*3/uL (ref 3.6–11.0)

## 2017-09-20 LAB — ECHOCARDIOGRAM COMPLETE
HEIGHTINCHES: 62 in
Weight: 1664 oz

## 2017-09-20 LAB — TROPONIN I: Troponin I: <0.03 ng/mL (ref <0.03)

## 2017-09-20 MED ORDER — ACETAMINOPHEN 325 MG PO TABS: 650.0000 mg | ORAL_TABLET | Freq: Four times a day (QID) | ORAL | Status: DC | PRN

## 2017-09-20 MED ORDER — TRAZODONE HCL 50 MG PO TABS
25.0000 mg | ORAL_TABLET | Freq: Every evening | ORAL | Status: DC | PRN
  Administered 2017-09-20: 25 mg via ORAL
  Filled 2017-09-20: qty 1

## 2017-09-20 MED ORDER — SODIUM CHLORIDE 0.9 % IV SOLN
INTRAVENOUS | Status: DC
  Administered 2017-09-20 (×3): via INTRAVENOUS

## 2017-09-20 MED ORDER — ADULT MULTIVITAMIN W/MINERALS CH
1.0000 | ORAL_TABLET | Freq: Every day | ORAL | Status: DC
  Administered 2017-09-20 – 2017-09-21 (×2): 1 via ORAL
  Filled 2017-09-20 (×2): qty 1

## 2017-09-20 MED ORDER — ONDANSETRON HCL 4 MG/2ML IJ SOLN: 4.0000 mg | Freq: Four times a day (QID) | INTRAMUSCULAR | Status: DC | PRN

## 2017-09-20 MED ORDER — DOCUSATE SODIUM 100 MG PO CAPS
100.0000 mg | ORAL_CAPSULE | Freq: Two times a day (BID) | ORAL | Status: DC
  Administered 2017-09-20 – 2017-09-21 (×2): 100 mg via ORAL
  Filled 2017-09-20 (×3): qty 1

## 2017-09-20 MED ORDER — BENAZEPRIL HCL 20 MG PO TABS
40.0000 mg | ORAL_TABLET | Freq: Every day | ORAL | Status: DC
  Administered 2017-09-20: 40 mg via ORAL
  Filled 2017-09-20: qty 2

## 2017-09-20 MED ORDER — GUAIFENESIN ER 600 MG PO TB12: 600.0000 mg | ORAL_TABLET | Freq: Two times a day (BID) | ORAL | Status: DC | PRN

## 2017-09-20 MED ORDER — DIAZEPAM 5 MG PO TABS: 10.0000 mg | ORAL_TABLET | Freq: Two times a day (BID) | ORAL | Status: DC | PRN

## 2017-09-20 MED ORDER — HEPARIN SODIUM (PORCINE) 5000 UNIT/ML IJ SOLN
5000.0000 [IU] | Freq: Three times a day (TID) | INTRAMUSCULAR | Status: DC
  Administered 2017-09-20 – 2017-09-21 (×4): 5000 [IU] via SUBCUTANEOUS
  Filled 2017-09-20 (×5): qty 1

## 2017-09-20 MED ORDER — ACETAMINOPHEN 650 MG RE SUPP: 650.0000 mg | Freq: Four times a day (QID) | RECTAL | Status: DC | PRN

## 2017-09-20 MED ORDER — MULTIVITAMINS PO CAPS: 1.0000 | ORAL_CAPSULE | Freq: Every day | ORAL | Status: DC

## 2017-09-20 MED ORDER — VERAPAMIL HCL ER 240 MG PO TBCR
240.0000 mg | EXTENDED_RELEASE_TABLET | Freq: Every day | ORAL | Status: DC
  Administered 2017-09-20: 240 mg via ORAL
  Filled 2017-09-20: qty 1

## 2017-09-20 MED ORDER — SODIUM CHLORIDE 0.9 % IV BOLUS
500.0000 mL | Freq: Once | INTRAVENOUS | Status: AC
  Administered 2017-09-20: 500 mL via INTRAVENOUS

## 2017-09-20 MED ORDER — FLUTICASONE PROPIONATE 50 MCG/ACT NA SUSP
2.0000 | Freq: Every day | NASAL | Status: DC
  Administered 2017-09-21: 2 via NASAL
  Filled 2017-09-20: qty 16

## 2017-09-20 MED ORDER — HYDROCODONE-ACETAMINOPHEN 5-325 MG PO TABS: 1.0000 | ORAL_TABLET | ORAL | Status: DC | PRN

## 2017-09-20 MED ORDER — BISACODYL 5 MG PO TBEC: 5.0000 mg | DELAYED_RELEASE_TABLET | Freq: Every day | ORAL | Status: DC | PRN

## 2017-09-20 MED ORDER — ONDANSETRON HCL 4 MG PO TABS: 4.0000 mg | ORAL_TABLET | Freq: Four times a day (QID) | ORAL | Status: DC | PRN

## 2017-09-20 NOTE — Progress Notes (Signed)
During rounds patient's blood pressure was 76/47 and repeat 76/51. She did ger her blood pressure medications this morning. MD Posey Pronto paged and notified, verbal orders to give 575ml sodium chloride 0.9%  bolus. Repeated verbal order. Will administer and continue to monitor patient. At this time patient laying in the bed, asymptomatic with no complaints.

## 2017-09-20 NOTE — Progress Notes (Signed)
Reassessment of patient: after 578ml bolus patient's blood pressure is getting better, 90/59 ([id number]). Patient eating dinner with no complaints. Will continue to monitor patient.

## 2017-09-20 NOTE — Plan of Care (Signed)
Minimal active movement noted due to cerebral palsy.  Turning every 2 hours initiated.

## 2017-09-20 NOTE — Progress Notes (Signed)
*  PRELIMINARY RESULTS* Echocardiogram 2D Echocardiogram has been performed.  Suzanne Jordan Suzanne Jordan 09/20/2017, 8:40 AM

## 2017-09-20 NOTE — Progress Notes (Signed)
[facility] [place] NAME: Suzanne Jordan    MR#:  [id number]  DATE OF BIRTH:  07-30-1945  SUBJECTIVE:   Came in after passing out at home Has been feeling poorly due to recent infectionn REVIEW OF SYSTEMS:   Review of Systems  Constitutional: Negative for chills, fever and weight loss.  HENT: Negative for ear discharge, ear pain and nosebleeds.   Eyes: Negative for blurred vision, pain and discharge.  Respiratory: Negative for sputum production, shortness of breath, wheezing and stridor.   Cardiovascular: Negative for chest pain, palpitations, orthopnea and PND.  Gastrointestinal: Negative for abdominal pain, diarrhea, nausea and vomiting.  Genitourinary: Negative for frequency and urgency.  Musculoskeletal: Negative for back pain and joint pain.  Neurological: Positive for weakness. Negative for sensory change, speech change and focal weakness.  Psychiatric/Behavioral: Negative for depression and hallucinations. The patient is not nervous/anxious.    Tolerating Diet:yes Tolerating PT: WC bound  DRUG ALLERGIES:  No Known Allergies  VITALS:  Blood pressure 105/66, pulse (!) 104, temperature 98.3 F (36.8 C), temperature source Oral, resp. rate 18, height 5\' 2"  (1.575 m), weight 47.2 kg (104 lb), SpO2 99 %.  PHYSICAL EXAMINATION:   Physical Exam  GENERAL:  72 y.o.-year-old patient lying in the bed with no acute distress. thin EYES: Pupils equal, round, reactive to light and accommodation. No scleral icterus. Extraocular muscles intact.  HEENT: Head atraumatic, normocephalic. Oropharynx and nasopharynx clear.  NECK:  Supple, no jugular venous distention. No thyroid enlargement, no tenderness.  LUNGS: Normal breath sounds bilaterally, no wheezing, rales, rhonchi. No use of accessory muscles of respiration.  CARDIOVASCULAR: S1, S2 normal. No murmurs, rubs, or gallops.  ABDOMEN: Soft, nontender, nondistended. Bowel sounds  present. No organomegaly or mass.  EXTREMITIES: No cyanosis, clubbing or edema b/l.    NEUROLOGIC: limited due to cerebral palsy. Moves UE well PSYCHIATRIC:  patient is alert and oriented x 3.  SKIN: No obvious rash, lesion, or ulcer.    LABORATORY PANEL:  CBC Recent Labs  Lab 09/20/17 0534  WBC 6.1  HGB 10.7*  HCT 30.7*  PLT 269    Chemistries  Recent Labs  Lab 09/19/17 1904 09/20/17 0534  NA 142 144  K 4.5 4.2  CL 111 114*  CO2 22 23  GLUCOSE 94 88  BUN 46* 44*  CREATININE 2.01* 1.77*  CALCIUM 9.3 8.4*  AST 102*  --   ALT 47  --   ALKPHOS 431*  --   BILITOT 2.5*  --    Cardiac Enzymes Recent Labs  Lab 09/20/17 0534  TROPONINI <0.03   RADIOLOGY:  Ct Head Wo Contrast  Result Date: 09/19/2017 CLINICAL DATA:  Altered level of consciousness. EXAM: CT HEAD WITHOUT CONTRAST TECHNIQUE: Contiguous axial images were obtained from the base of the skull through the vertex without intravenous contrast. COMPARISON:  None. FINDINGS: Brain: No evidence of acute infarction, hemorrhage, extra-axial collection, ventriculomegaly, or mass effect. 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 Old left basal ganglia lacunar infarcts. Generalized cerebral atrophy. Periventricular white matter low attenuation likely secondary to microangiopathy. Vascular: Cerebrovascular atherosclerotic calcifications are noted. Skull: Negative for fracture or focal lesion. Sinuses/Orbits: Visualized portions of the orbits are unremarkable. Visualized portions of the paranasal sinuses and mastoid air cells are unremarkable. Other: None. IMPRESSION: No acute intracranial pathology. Electronically Signed   By: Kathreen Devoid   On: 09/19/2017 19:55   US Carotid Bilateral  Result Date: 09/20/2017 CLINICAL DATA:  Syncope  EXAM: BILATERAL CAROTID DUPLEX ULTRASOUND TECHNIQUE: Pearline Cables scale imaging, color Doppler and duplex ultrasound were performed of bilateral carotid and vertebral arteries in the neck.  COMPARISON:  None. FINDINGS: Criteria: Quantification of carotid stenosis is based on velocity parameters that correlate the residual internal carotid diameter with NASCET-based stenosis levels, using the diameter of the distal internal carotid lumen as the denominator for stenosis measurement. The following velocity measurements were obtained: RIGHT ICA:  47 cm/sec CCA:  67 cm/sec SYSTOLIC ICA/CCA RATIO:  0.7 DIASTOLIC ICA/CCA RATIO: ECA:  65 cm/sec LEFT ICA:  69 cm/sec CCA:  71 cm/sec SYSTOLIC ICA/CCA RATIO:  1.0 DIASTOLIC ICA/CCA RATIO: ECA:  69 cm/sec RIGHT CAROTID ARTERY: Little if any plaque in the bulb. Low resistance internal carotid Doppler pattern is preserved. RIGHT VERTEBRAL ARTERY:  Antegrade. LEFT CAROTID ARTERY: Little if any plaque in the bulb. Low resistance internal carotid Doppler pattern is preserved. LEFT VERTEBRAL ARTERY:  Antegrade. IMPRESSION: Less than 50% stenosis in the right and left internal carotid arteries. Electronically Signed   By: Marybelle Killings M.D.   On: 09/20/2017 09:19   Dg Chest Port 1 View  Result Date: 09/19/2017 CLINICAL DATA:  Weakness EXAM: PORTABLE CHEST 1 VIEW COMPARISON:  None. FINDINGS: Mild elevation of the right diaphragm. Mild scarring or atelectasis at the left base. No focal consolidation or significant effusion. Heart size upper normal. No pneumothorax. Probable skin fold artifact left lower lateral chest wall. Possible old fracture deformity proximal right humerus. IMPRESSION: No active disease. Mild elevation of the right diaphragm with atelectasis or scar at the left base. Electronically Signed   By: Donavan Foil M.D.   On: 09/19/2017 19:59   ASSESSMENT AND PLAN:  Suzanne Jordan  is a 72 y.o. female with a known history of hypertension and cerebral palsy, mostly bedridden. Patient was brought to emergency room status post a syncopal episode.  She was brought in by EMS after being found down by friends.  Patient does not recall the episode and is poor  historian due to her medical comorbidities  1.  Syncope, unclear etiology.  It could have been related to acute renal failure. -CT head negative -Remains in SR -feels better today -US carotids no significant stenosis -no new neuro findings  2.  Acute renal failure.  Creatinine is elevated at 2.01, while baseline in this patient is at 1.05.  This is likely prerenal, secondary to poor p.o. Intake.   -cont  gentle IV hydration, will avoid diuretics.  Continue to monitor kidney function closely and avoid nephrotoxic medications. -pt has not been eating and drinking well due to her recent infection -feels better now  3.  Stage I sacral pressure ulcer.  Will turn patient every 2 hours and follow wound care treatment.  4.  Cerebral palsy, mostly bedridden.  Continue home medications and supportive measures.  CM for d/c planning  Case discussed with Care Management/Social Worker. Management plans discussed with the patient, family and they are in agreement.  CODE STATUS: full  DVT Prophylaxis: lovenox  TOTAL TIME TAKING CARE OF THIS PATIENT: *30* minutes.  >50% time spent on counselling and coordination of care  POSSIBLE D/C IN *1-2 DAYS, DEPENDING ON CLINICAL CONDITION.  Note: This dictation was prepared with Dragon dictation along with smaller phrase technology. Any transcriptional errors that result from this process are unintentional.  Fritzi Mandes M.D on 09/20/2017 at 3:48 PM  Between 7am to 6pm - Pager - [phone]  After 6pm go to [email or web address] - [place]  [phone]  CC: Primary care physician; Guadalupe Maple, MDPatient ID: [name], female   DOB: Dec 11, 1945, 72 y.o.   MRN: [id number]

## 2017-09-21 LAB — BASIC METABOLIC PANEL
Anion gap: 7 (ref 5–15)
BUN: 39 mg/dL — AB (ref 6–20)
CO2: 20 mmol/L — ABNORMAL LOW (ref 22–32)
CREATININE: 1.67 mg/dL — AB (ref 0.44–1.00)
Calcium: 8.2 mg/dL — ABNORMAL LOW (ref 8.9–10.3)
Chloride: 115 mmol/L — ABNORMAL HIGH (ref 101–111)
GFR calc Af Amer: 34 mL/min — ABNORMAL LOW (ref >60)
GFR, EST NON AFRICAN AMERICAN: 30 mL/min — AB (ref >60)
Glucose, Bld: 101 mg/dL — ABNORMAL HIGH (ref 65–99)
Potassium: 4.1 mmol/L (ref 3.5–5.1)
SODIUM: 142 mmol/L (ref 135–145)

## 2017-09-21 LAB — GLUCOSE, CAPILLARY: GLUCOSE-CAPILLARY: 94 mg/dL (ref 65–99)

## 2017-09-21 MED ORDER — MIDODRINE HCL 2.5 MG PO TABS: 2.5000 mg | ORAL_TABLET | Freq: Two times a day (BID) | ORAL | 1 refills | Status: AC

## 2017-09-21 MED ORDER — MIDODRINE HCL 5 MG PO TABS: 2.5000 mg | ORAL_TABLET | Freq: Two times a day (BID) | ORAL | Status: DC

## 2017-09-21 NOTE — Care Management (Signed)
RNCM spoke with patient regarding discharge today. She states that her friend Lelon Frohlich is here to take her home. She is not interested in placement at ALF but she is aware that she will need to contact her Medicaid worker for LTC assistance. She states she her son will return home in a couple of months and will need to be there for him. If she goes into LTC- she will have to leave her mobile home and she has shared that this is where son lives too.  No other RNCM needs.

## 2017-09-21 NOTE — Clinical Social Work Note (Addendum)
CSW was informed by case manager that patient would like information about going to ALFs.  CSW discussed with patient that she can speak with her Medicaid social worker tomorrow, and also the home health social worker to try to see what other resources are available for in home care.  CSW also provided patient a list of ALFs and family care homes.  Patient was appreciative of information given.  CSW to sign off please reconsult if other social work needs arise.  Jones Broom. [place], MSW, [place]  09/21/2017 12:15 PM

## 2017-09-21 NOTE — Care Management Note (Addendum)
Case Management Note  Patient Details  Name: Suzanne Jordan MRN: [id number] Date of Birth: 12-Dec-1945  Subjective/Objective:                  RNCM spoke with patient regarding transition to home. She has Medicaid. Patient lives alone. She lives in a mobile home- [address]  She has Cerebral palsy per patient.  Patient agrees for me to contact Inez Catalina for transportation- [phone]. Medicaid pays for some PCS services in [place] but this is limited per [facility]. "Son works out of town" per patient and patient did not want me to involve him.  Armond Hang said he's in jail. Patient has an Clinical research associate, standard wheelchair, two ramps. Patient DID NOT FALL AT [facility]. HER FRIEND [name] FOUND HER IN THE HOME WHERE SHE HAD FALLEN AND HAD BEEN LAYING FOR UNKNOWN AMOUNT OF TIME. Per patient she has been transferring by holding on to furniture when going to bathroom. She agrees to home health services without preference. PCP Crissmon. Usually uses [facility] to get to PCP. Inez Catalina states that some of the ladies in the church have been talking to her about going to ALF. Patient admits that she will need more assistance at home but does not have a plan. She wants to go home. Inez Catalina does not feel that patient is in a safe living environment and she is not willing to provide assistance back to this environment.  Action/Plan: [facility]. CSW updated. I have notified DR. Crissmon also.    Expected Discharge Date:                  Expected Discharge Plan:     In-House Referral:     Discharge planning Services  CM Consult  Post Acute Care Choice:  Home Health Choice offered to:  Patient  DME Arranged:    DME Agency:     HH Arranged:  PT, OT HH Agency:     Status of Service:  In process, will continue to follow  If discussed at Long Length of Stay Meetings, dates discussed:    Additional Comments:  Marshell Garfinkel, RN 09/21/2017, 10:40 AM

## 2017-09-21 NOTE — Discharge Instructions (Signed)

## 2017-09-21 NOTE — Discharge Summary (Addendum)
[facility] [place] NAME: Suzanne Jordan    MR#:  [id number]  DATE OF BIRTH:  1945/07/02  DATE OF ADMISSION:  09/19/2017 ADMITTING PHYSICIAN: Amelia Jo, MD  DATE OF DISCHARGE: 09/21/2017  PRIMARY CARE PHYSICIAN: Guadalupe Maple, MD    ADMISSION DIAGNOSIS:  Weakness [R53.1] AKI (acute kidney injury) ([place]) [N17.9] Syncope, unspecified syncope type [R55]  DISCHARGE DIAGNOSIS:  Syncope due to dehydration/ARF  SECONDARY DIAGNOSIS:   Past Medical History:  Diagnosis Date  . Breast cancer ([place]) 2010   lumpectomy  . Cancer ([place]) 2010   T1c, N0, ER/ PR +. 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   . Cerebral palsy ([place])    at birth    HOSPITAL COURSE:  [name],mostly bedridden.Patient was brought to emergency room status post a syncopal episode.She was brought in by EMS after being found down by friend.Patient does not recall the episode and is poor historian due to her medical comorbidities  1.Syncope,unclear etiology but suspected due to acute renal failure. -CT head negative -Remains in SR -feels better today -US carotids no significant stenosis -no new neuro findings  2.Acute renal failure.Creatinine is elevated at 2.01,while baseline in this patient is at 1.05.This is likely prerenal,secondary to poor p.o. Intake. Creat 1.6 today -recieved  gentle IV hydration,will avoid diuretics. -Continue to monitor kidney function closely and avoid nephrotoxic medications. -pt has not been eating and drinking well due to her recent infection--now much better -feels better now  3.Stage I sacral pressure ulcer.Will turn patient every 2 hours and follow wound care treatment.  4.Cerebral palsy,mostly bedridden.Continue home medications and supportive measures.  5. H/o HTN Pt's bp as outpt (reviewed PCP notes) has been 95-638  systolic. She is asymptomatic. She is taking verapamil, HCTZ and benazepril which I have d/c for now. -starting low dose midodrine with holding parameter -pt feels a bit weak and overall low BP could be contributing to it.  CM for d/c planning Home with HHRN, aide, PT and SW   I have d/w pt regarding her long term living situation. She will d/w her son and PCP to see what options are there for her.   CONSULTS OBTAINED:    DRUG ALLERGIES:  No Known Allergies  DISCHARGE MEDICATIONS:   Allergies as of 09/21/2017   No Known Allergies     Medication List    STOP taking these medications   benazepril 40 MG tablet Commonly known as:  LOTENSIN   guaiFENesin 600 MG 12 hr tablet Commonly known as:  MUCINEX   hydrochlorothiazide 12.5 MG capsule Commonly known as:  MICROZIDE   verapamil 240 MG CR tablet Commonly known as:  CALAN-SR     TAKE these medications   diazepam 10 MG tablet Commonly known as:  VALIUM Take 1 tablet (10 mg total) by mouth every 12 (twelve) hours as needed for anxiety.   fluticasone 50 MCG/ACT nasal spray Commonly known as:  FLONASE Place 2 sprays into both nostrils daily.   midodrine 2.5 MG tablet Commonly known as:  PROAMATINE Take 1 tablet (2.5 mg total) by mouth 2 (two) times daily with a meal. Hold if BP >756 systolic   multivitamin capsule Take 1 capsule by mouth daily.       If you experience worsening of your admission symptoms, develop shortness of breath, life threatening emergency, suicidal or homicidal thoughts you must seek medical attention immediately by calling 911 or  calling your MD immediately  if symptoms less severe.  You Must read complete instructions/literature along with all the possible adverse reactions/side effects for all the Medicines you take and that have been prescribed to you. Take any new Medicines after you have completely understood and accept all the possible adverse reactions/side effects.   Please  note  You were cared for by a hospitalist during your hospital stay. If you have any questions about your discharge medications or the care you received while you were in the hospital after you are discharged, you can call the unit and asked to speak with the hospitalist on call if the hospitalist that took care of you is not available. Once you are discharged, your primary care physician will handle any further medical issues. Please note that NO REFILLS for any discharge medications will be authorized once you are discharged, as it is imperative that you return to your primary care physician (or establish a relationship with a primary care physician if you do not have one) for your aftercare needs so that they can reassess your need for medications and monitor your lab values. Today   SUBJECTIVE  Feels better at at baseline   VITAL SIGNS:  Blood pressure (!) 86/56, pulse 85, temperature 98.1 F (36.7 C), temperature source Oral, resp. rate 18, height 5\' 2"  (1.575 m), weight 48.2 kg (106 lb 4.8 oz), SpO2 95 %.  I/O:    Intake/Output Summary (Last 24 hours) at 09/21/2017 1045 Last data filed at 09/21/2017 0620 Gross per 24 hour  Intake 1890 ml  Output 700 ml  Net 1190 ml    PHYSICAL EXAMINATION:  GENERAL:  72 y.o.-year-old patient lying in the bed with no acute distress.  EYES: Pupils equal, round, reactive to light and accommodation. No scleral icterus. Extraocular muscles intact.  HEENT: Head atraumatic, normocephalic. Oropharynx and nasopharynx clear.  NECK:  Supple, no jugular venous distention. No thyroid enlargement, no tenderness.  LUNGS: Normal breath sounds bilaterally, no wheezing, rales,rhonchi or crepitation. No use of accessory muscles of respiration.  CARDIOVASCULAR: S1, S2 normal. No murmurs, rubs, or gallops.  ABDOMEN: Soft, non-tender, non-distended. Bowel sounds present. No organomegaly or mass.  EXTREMITIES: No pedal edema, cyanosis, or clubbing.  NEUROLOGIC:  functional hemiplegia due to cerebral palsy PSYCHIATRIC: The patient is alert and oriented x 3.  SKIN: chronic decubitus  DATA REVIEW:   CBC  Recent Labs  Lab 09/20/17 0534  WBC 6.1  HGB 10.7*  HCT 30.7*  PLT 269    Chemistries  Recent Labs  Lab 09/19/17 1904  09/21/17 0750  NA 142   < > 142  K 4.5   < > 4.1  CL 111   < > 115*  CO2 22   < > 20*  GLUCOSE 94   < > 101*  BUN 46*   < > 39*  CREATININE 2.01*   < > 1.67*  CALCIUM 9.3   < > 8.2*  AST 102*  --   --   ALT 47  --   --   ALKPHOS 431*  --   --   BILITOT 2.5*  --   --    < > = values in this interval not displayed.    Microbiology Results   No results found for this or any previous visit (from the past 240 hour(s)).  RADIOLOGY:  Ct Head Wo Contrast  Result Date: 09/19/2017 CLINICAL DATA:  Altered level of consciousness. EXAM: CT HEAD WITHOUT CONTRAST TECHNIQUE: Contiguous axial  images were obtained from the base of the skull through the vertex without intravenous contrast. COMPARISON:  None. FINDINGS: Brain: No evidence of acute infarction, hemorrhage, extra-axial collection, ventriculomegaly, or mass effect. 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 Old left basal ganglia lacunar infarcts. Generalized cerebral atrophy. Periventricular white matter low attenuation likely secondary to microangiopathy. Vascular: Cerebrovascular atherosclerotic calcifications are noted. Skull: Negative for fracture or focal lesion. Sinuses/Orbits: Visualized portions of the orbits are unremarkable. Visualized portions of the paranasal sinuses and mastoid air cells are unremarkable. Other: None. IMPRESSION: No acute intracranial pathology. Electronically Signed   By: Kathreen Devoid   On: 09/19/2017 19:55   US Carotid Bilateral  Result Date: 09/20/2017 CLINICAL DATA:  Syncope EXAM: BILATERAL CAROTID DUPLEX ULTRASOUND TECHNIQUE: Pearline Cables scale imaging, color Doppler and duplex ultrasound were performed of bilateral carotid and  vertebral arteries in the neck. COMPARISON:  None. FINDINGS: Criteria: Quantification of carotid stenosis is based on velocity parameters that correlate the residual internal carotid diameter with NASCET-based stenosis levels, using the diameter of the distal internal carotid lumen as the denominator for stenosis measurement. The following velocity measurements were obtained: RIGHT ICA:  47 cm/sec CCA:  67 cm/sec SYSTOLIC ICA/CCA RATIO:  0.7 DIASTOLIC ICA/CCA RATIO: ECA:  65 cm/sec LEFT ICA:  69 cm/sec CCA:  71 cm/sec SYSTOLIC ICA/CCA RATIO:  1.0 DIASTOLIC ICA/CCA RATIO: ECA:  69 cm/sec RIGHT CAROTID ARTERY: Little if any plaque in the bulb. Low resistance internal carotid Doppler pattern is preserved. RIGHT VERTEBRAL ARTERY:  Antegrade. LEFT CAROTID ARTERY: Little if any plaque in the bulb. Low resistance internal carotid Doppler pattern is preserved. LEFT VERTEBRAL ARTERY:  Antegrade. IMPRESSION: Less than 50% stenosis in the right and left internal carotid arteries. Electronically Signed   By: Marybelle Killings M.D.   On: 09/20/2017 09:19   Dg Chest Port 1 View  Result Date: 09/19/2017 CLINICAL DATA:  Weakness EXAM: PORTABLE CHEST 1 VIEW COMPARISON:  None. FINDINGS: Mild elevation of the right diaphragm. Mild scarring or atelectasis at the left base. No focal consolidation or significant effusion. Heart size upper normal. No pneumothorax. Probable skin fold artifact left lower lateral chest wall. Possible old fracture deformity proximal right humerus. IMPRESSION: No active disease. Mild elevation of the right diaphragm with atelectasis or scar at the left base. Electronically Signed   By: Donavan Foil M.D.   On: 09/19/2017 19:59     Management plans discussed with the patient, family and they are in agreement.  CODE STATUS:     Code Status Orders  (From admission, onward)        Start     Ordered   09/20/17 0007  Full code  Continuous     09/20/17 0007    Code Status History    This patient has  a current code status but no historical code status.    Advance Directive Documentation     Most Recent Value  Type of Advance Directive  Healthcare Power of Attorney, Living will  Pre-existing out of facility DNR order (yellow form or pink MOST form)  -  "MOST" Form in Place?  -      TOTAL TIME TAKING CARE OF THIS PATIENT: *40* minutes.    Fritzi Mandes M.D on 09/21/2017 at 10:45 AM  Between 7am to 6pm - Pager - [phone] After 6pm go to [email or web address] - password [email or web address] [facility] [facility]  Office  [phone]  CC: Primary care physician; Guadalupe Maple, MD

## 2017-09-22 ENCOUNTER — Telehealth: Payer: Self-pay

## 2017-09-22 ENCOUNTER — Ambulatory Visit: Admission: RE | Admit: 2017-09-22 | Payer: Medicare Other | Source: Ambulatory Visit

## 2017-09-22 ENCOUNTER — Ambulatory Visit: Payer: Self-pay

## 2017-09-22 ENCOUNTER — Telehealth: Payer: Self-pay | Admitting: *Deleted

## 2017-09-22 NOTE — Telephone Encounter (Signed)
Pt was dc'd from hospital 5/27 and pt cannot walk or sit upright or take care of herself due to weakness. [facility] is due to visit pt tomorrow. Pt requesting to go to rehab for 24 care today.   Reason for Disposition . [1] Follow-up call to recent contact AND [2] information only call, no triage required  Answer Assessment - Initial Assessment Questions 1. REASON FOR CALL or QUESTION: "What is your reason for calling today?" or "How can I best help you?" or "What question do you have that I can help answer?"     Pt was dc'd from hospital 5/27 and pt cannot walk or sit upright due to weakness. [facility] is due to visit pt tomorrow. Pt requesting to go to rehab for 24 care today.  Protocols used: INFORMATION ONLY CALL-A-AH

## 2017-09-22 NOTE — Telephone Encounter (Signed)
Needs to go back to the ER- they should be able to get her into rehab from there with the social workers

## 2017-09-22 NOTE — Telephone Encounter (Signed)
Patient called their office stating she was in the hospital over the weekend for kidney problems and needs her liver biopsy for today cancelled and rescheduled for 1 - 2 weeks. I spoke with Colette regarding this and she will handle it

## 2017-09-22 NOTE — Telephone Encounter (Signed)
Patient called to state that she had been in the ER over the weekend. She is scheduled today for an ultrasound guided biopsy of her liver ordered by Honor Loh NP. She wants to cancel this and will call back to reschedule this in a week or two once she is up to it. I let her know that this was ordered by the [facility] but I would go ahead and inform them of her decision.  A message has been left with the Triage Nurse for the cancer center about this.

## 2017-09-22 NOTE — Telephone Encounter (Signed)
Hassan Rowan from the cancer center called back and will cancel her appointment and see about getting her rescheduled.

## 2017-09-22 NOTE — Telephone Encounter (Signed)
Message relayed to patient. Verbalized understanding and denied questions.   

## 2017-09-23 ENCOUNTER — Telehealth: Payer: Self-pay | Admitting: Family Medicine

## 2017-09-23 ENCOUNTER — Telehealth: Payer: Self-pay

## 2017-09-23 NOTE — Telephone Encounter (Signed)
Copied from [place] [phone]. Topic: Quick Communication - See Telephone Encounter >> Sep 23, 2017 10:18 AM Synthia Innocent wrote: CRM for notification. See Telephone encounter for: 09/23/17. [facility] calling and stating patient has been accepted for home PT not nursing.

## 2017-09-23 NOTE — Telephone Encounter (Signed)
See message. Just FYI °

## 2017-09-23 NOTE — Telephone Encounter (Signed)
Transition Care Management Follow-up Telephone Call   Date discharged? 09/21/2017   How have you been since you were released from the hospital? "still very weak and tired"   Do you understand why you were in the hospital? yes   Do you understand the discharge instructions? yes   Where were you discharged to? home   Items Reviewed:  Medications reviewed: yes  Allergies reviewed: yes  Dietary changes reviewed: yes  Referrals reviewed: yes   Functional Questionnaire:   Activities of Daily Living (ADLs):   She states they are independent in the following: feeding, continence, grooming and toileting States they require assistance with the following: ambulation, bathing and hygiene and dressing   Any transportation issues/concerns?: no   Any patient concerns? yes   Confirmed importance and date/time of follow-up visits scheduled yes  Provider Appointment booked with Dr.Crissman on 09/30/2017 at 9:30am   Confirmed with patient if condition begins to worsen call PCP or go to the ER.  Patient was given the office number and encouraged to call back with question or concerns.  : yes

## 2017-09-26 DIAGNOSIS — N179 Acute kidney failure, unspecified: Secondary | ICD-10-CM | POA: Diagnosis not present

## 2017-09-26 DIAGNOSIS — I10 Essential (primary) hypertension: Secondary | ICD-10-CM | POA: Diagnosis not present

## 2017-09-26 DIAGNOSIS — G809 Cerebral palsy, unspecified: Secondary | ICD-10-CM | POA: Diagnosis not present

## 2017-09-26 DIAGNOSIS — K7689 Other specified diseases of liver: Secondary | ICD-10-CM | POA: Diagnosis not present

## 2017-09-26 DIAGNOSIS — Z9181 History of falling: Secondary | ICD-10-CM | POA: Diagnosis not present

## 2017-09-26 DIAGNOSIS — Z853 Personal history of malignant neoplasm of breast: Secondary | ICD-10-CM | POA: Diagnosis not present

## 2017-09-26 DIAGNOSIS — R55 Syncope and collapse: Secondary | ICD-10-CM | POA: Diagnosis not present

## 2017-09-26 DIAGNOSIS — M2450 Contracture, unspecified joint: Secondary | ICD-10-CM | POA: Diagnosis not present

## 2017-09-26 DIAGNOSIS — Z7901 Long term (current) use of anticoagulants: Secondary | ICD-10-CM | POA: Diagnosis not present

## 2017-09-27 ENCOUNTER — Emergency Department: Payer: Medicare Other

## 2017-09-27 ENCOUNTER — Other Ambulatory Visit: Payer: Self-pay

## 2017-09-27 ENCOUNTER — Encounter: Payer: Self-pay | Admitting: Emergency Medicine

## 2017-09-27 ENCOUNTER — Inpatient Hospital Stay
Admission: EM | Admit: 2017-09-27 | Discharge: 2017-10-01 | DRG: 436 | Disposition: A | Discharge status: Skilled Nursing Facility | Payer: Medicare Other | Attending: Internal Medicine | Admitting: Internal Medicine

## 2017-09-27 DIAGNOSIS — Z923 Personal history of irradiation: Secondary | ICD-10-CM

## 2017-09-27 DIAGNOSIS — E860 Dehydration: Secondary | ICD-10-CM | POA: Diagnosis not present

## 2017-09-27 DIAGNOSIS — Z90710 Acquired absence of both cervix and uterus: Secondary | ICD-10-CM

## 2017-09-27 DIAGNOSIS — K769 Liver disease, unspecified: Secondary | ICD-10-CM

## 2017-09-27 DIAGNOSIS — R64 Cachexia: Secondary | ICD-10-CM | POA: Diagnosis present

## 2017-09-27 DIAGNOSIS — M6281 Muscle weakness (generalized): Secondary | ICD-10-CM | POA: Diagnosis not present

## 2017-09-27 DIAGNOSIS — Z7401 Bed confinement status: Secondary | ICD-10-CM

## 2017-09-27 DIAGNOSIS — R112 Nausea with vomiting, unspecified: Secondary | ICD-10-CM | POA: Diagnosis not present

## 2017-09-27 DIAGNOSIS — Z7189 Other specified counseling: Secondary | ICD-10-CM

## 2017-09-27 DIAGNOSIS — G809 Cerebral palsy, unspecified: Secondary | ICD-10-CM | POA: Diagnosis not present

## 2017-09-27 DIAGNOSIS — C787 Secondary malignant neoplasm of liver and intrahepatic bile duct: Secondary | ICD-10-CM | POA: Diagnosis not present

## 2017-09-27 DIAGNOSIS — R7989 Other specified abnormal findings of blood chemistry: Secondary | ICD-10-CM

## 2017-09-27 DIAGNOSIS — I10 Essential (primary) hypertension: Secondary | ICD-10-CM | POA: Diagnosis present

## 2017-09-27 DIAGNOSIS — C801 Malignant (primary) neoplasm, unspecified: Secondary | ICD-10-CM | POA: Diagnosis present

## 2017-09-27 DIAGNOSIS — R627 Adult failure to thrive: Secondary | ICD-10-CM | POA: Diagnosis present

## 2017-09-27 DIAGNOSIS — I959 Hypotension, unspecified: Secondary | ICD-10-CM | POA: Diagnosis not present

## 2017-09-27 DIAGNOSIS — Z9221 Personal history of antineoplastic chemotherapy: Secondary | ICD-10-CM

## 2017-09-27 DIAGNOSIS — Z7951 Long term (current) use of inhaled steroids: Secondary | ICD-10-CM

## 2017-09-27 DIAGNOSIS — Z853 Personal history of malignant neoplasm of breast: Secondary | ICD-10-CM

## 2017-09-27 DIAGNOSIS — Z6822 Body mass index (BMI) 22.0-22.9, adult: Secondary | ICD-10-CM

## 2017-09-27 DIAGNOSIS — R188 Other ascites: Secondary | ICD-10-CM

## 2017-09-27 DIAGNOSIS — J9811 Atelectasis: Secondary | ICD-10-CM | POA: Diagnosis not present

## 2017-09-27 DIAGNOSIS — R740 Nonspecific elevation of levels of transaminase and lactic acid dehydrogenase [LDH]: Secondary | ICD-10-CM | POA: Diagnosis not present

## 2017-09-27 DIAGNOSIS — Z66 Do not resuscitate: Secondary | ICD-10-CM | POA: Diagnosis not present

## 2017-09-27 DIAGNOSIS — R000 Tachycardia, unspecified: Secondary | ICD-10-CM | POA: Diagnosis present

## 2017-09-27 DIAGNOSIS — I9589 Other hypotension: Secondary | ICD-10-CM | POA: Diagnosis present

## 2017-09-27 DIAGNOSIS — Z515 Encounter for palliative care: Secondary | ICD-10-CM

## 2017-09-27 DIAGNOSIS — R6510 Systemic inflammatory response syndrome (SIRS) of non-infectious origin without acute organ dysfunction: Secondary | ICD-10-CM | POA: Diagnosis present

## 2017-09-27 DIAGNOSIS — Z79899 Other long term (current) drug therapy: Secondary | ICD-10-CM

## 2017-09-27 DIAGNOSIS — R180 Malignant ascites: Secondary | ICD-10-CM | POA: Diagnosis present

## 2017-09-27 DIAGNOSIS — J90 Pleural effusion, not elsewhere classified: Secondary | ICD-10-CM | POA: Diagnosis not present

## 2017-09-27 DIAGNOSIS — R531 Weakness: Secondary | ICD-10-CM | POA: Diagnosis not present

## 2017-09-27 DIAGNOSIS — E43 Unspecified severe protein-calorie malnutrition: Secondary | ICD-10-CM

## 2017-09-27 LAB — CBC WITH DIFFERENTIAL/PLATELET
BASOS ABS: 0 10*3/uL (ref 0–0.1)
Basophils Relative: 0 %
EOS PCT: 0 %
Eosinophils Absolute: 0 10*3/uL (ref 0–0.7)
HCT: 37 % (ref 35.0–47.0)
Hemoglobin: 12.4 g/dL (ref 12.0–16.0)
LYMPHS PCT: 26 %
Lymphs Abs: 2.7 10*3/uL (ref 1.0–3.6)
MCH: 31.3 pg (ref 26.0–34.0)
MCHC: 33.5 g/dL (ref 32.0–36.0)
MCV: 93.6 fL (ref 80.0–100.0)
MONO ABS: 1 10*3/uL — AB (ref 0.2–0.9)
MONOS PCT: 10 %
Neutro Abs: 6.6 10*3/uL — ABNORMAL HIGH (ref 1.4–6.5)
Neutrophils Relative %: 64 %
PLATELETS: 355 10*3/uL (ref 150–440)
RBC: 3.95 MIL/uL (ref 3.80–5.20)
RDW: 16.5 % — AB (ref 11.5–14.5)
WBC: 10.4 10*3/uL (ref 3.6–11.0)

## 2017-09-27 LAB — COMPREHENSIVE METABOLIC PANEL
ALT: 60 U/L — ABNORMAL HIGH (ref 14–54)
ANION GAP: 11 (ref 5–15)
AST: 126 U/L — AB (ref 15–41)
Albumin: 2.4 g/dL — ABNORMAL LOW (ref 3.5–5.0)
Alkaline Phosphatase: 552 U/L — ABNORMAL HIGH (ref 38–126)
BUN: 25 mg/dL — ABNORMAL HIGH (ref 6–20)
CHLORIDE: 102 mmol/L (ref 101–111)
CO2: 20 mmol/L — ABNORMAL LOW (ref 22–32)
Calcium: 8.8 mg/dL — ABNORMAL LOW (ref 8.9–10.3)
Creatinine, Ser: 1.11 mg/dL — ABNORMAL HIGH (ref 0.44–1.00)
GFR calc non Af Amer: 48 mL/min — ABNORMAL LOW (ref >60)
GFR, EST AFRICAN AMERICAN: 56 mL/min — AB (ref >60)
Glucose, Bld: 132 mg/dL — ABNORMAL HIGH (ref 65–99)
POTASSIUM: 4.5 mmol/L (ref 3.5–5.1)
Sodium: 133 mmol/L — ABNORMAL LOW (ref 135–145)
TOTAL PROTEIN: 6.5 g/dL (ref 6.5–8.1)
Total Bilirubin: 2.6 mg/dL — ABNORMAL HIGH (ref 0.3–1.2)

## 2017-09-27 LAB — LACTIC ACID, PLASMA
LACTIC ACID, VENOUS: 2.1 mmol/L — AB (ref 0.5–1.9)
Lactic Acid, Venous: 2 mmol/L (ref 0.5–1.9)

## 2017-09-27 LAB — TROPONIN I: Troponin I: 0.03 ng/mL (ref <0.03)

## 2017-09-27 MED ORDER — DIAZEPAM 5 MG PO TABS
10.0000 mg | ORAL_TABLET | Freq: Two times a day (BID) | ORAL | Status: DC | PRN
  Administered 2017-09-30: 10 mg via ORAL
  Filled 2017-09-27: qty 2

## 2017-09-27 MED ORDER — SODIUM CHLORIDE 0.9 % IV BOLUS
1000.0000 mL | Freq: Once | INTRAVENOUS | Status: AC
  Administered 2017-09-27: 1000 mL via INTRAVENOUS

## 2017-09-27 MED ORDER — MIDODRINE HCL 5 MG PO TABS
2.5000 mg | ORAL_TABLET | Freq: Two times a day (BID) | ORAL | Status: DC
  Administered 2017-09-28 – 2017-10-01 (×7): 2.5 mg via ORAL
  Filled 2017-09-27 (×8): qty 0.5

## 2017-09-27 MED ORDER — CEFTRIAXONE SODIUM 1 G IJ SOLR
1.0000 g | Freq: Once | INTRAMUSCULAR | Status: AC
  Administered 2017-09-27: 1 g via INTRAVENOUS
  Filled 2017-09-27: qty 10

## 2017-09-27 MED ORDER — ENOXAPARIN SODIUM 40 MG/0.4ML ~~LOC~~ SOLN
40.0000 mg | SUBCUTANEOUS | Status: DC
  Administered 2017-09-28 – 2017-10-01 (×4): 40 mg via SUBCUTANEOUS
  Filled 2017-09-27 (×4): qty 0.4

## 2017-09-27 MED ORDER — ONDANSETRON HCL 4 MG/2ML IJ SOLN: 4.0000 mg | Freq: Four times a day (QID) | INTRAMUSCULAR | Status: DC | PRN

## 2017-09-27 MED ORDER — SODIUM CHLORIDE 0.9 % IV SOLN
INTRAVENOUS | Status: AC
  Administered 2017-09-28: via INTRAVENOUS

## 2017-09-27 MED ORDER — ACETAMINOPHEN 650 MG RE SUPP: 650.0000 mg | Freq: Four times a day (QID) | RECTAL | Status: DC | PRN

## 2017-09-27 MED ORDER — ACETAMINOPHEN 325 MG PO TABS: 650.0000 mg | ORAL_TABLET | Freq: Four times a day (QID) | ORAL | Status: DC | PRN

## 2017-09-27 MED ORDER — ONDANSETRON HCL 4 MG PO TABS: 4.0000 mg | ORAL_TABLET | Freq: Four times a day (QID) | ORAL | Status: DC | PRN

## 2017-09-27 NOTE — ED Provider Notes (Signed)
[facility] Emergency Department Provider Note  ____________________________________________   I have reviewed the triage vital signs and the nursing notes.   HISTORY  Chief Complaint Weakness and Nausea   History limited by: Not Limited   HPI Suzanne Jordan is a 72 y.o. female who presents to the emergency department today because of not feeling well. She states she feels "yucky". Unclear exactly how long this has been present but was worse yesterday. She states she was nauseous and did have vomiting. She states that her bowel movements have been regular since she was recently in the hospital. She denies any abdominal pain. Denies any chest pain or shortness of breath. Denies any fevers.    Per medical record review patient has a history of recent hospitalization due to syncope, and aki.  Past Medical History:  Diagnosis Date  . Breast cancer ([place]) 2010   lumpectomy  . Cancer ([place]) 2010   T1c, N0, ER/ PR +. 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   . 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 Syncope 09/19/2017  . Goals of care, counseling/discussion 09/04/2017  . Liver masses 08/26/2017  . Advanced care planning/counseling discussion 04/01/2017  . History of breast cancer 07/17/2015  . Essential hypertension 01/24/2015  . Cerebral palsy ([place]) 01/24/2015  . Malignant neoplasm of upper-outer quadrant of female breast [facility])     Past Surgical History:  Procedure Laterality Date  . ABDOMINAL HYSTERECTOMY  1970's  . BREAST LUMPECTOMY Right 2010  . BREAST SURGERY Right 2010   wide excision with sn biopsy  . CATARACT EXTRACTION    . FRACTURE SURGERY Right    right leg, as a child  . TUBAL LIGATION      Prior to Admission medications   Medication Sig Start Date End Date Taking? Authorizing Provider  diazepam (VALIUM) 10 MG tablet Take 1 tablet (10 mg total) by mouth every 12 (twelve) hours as needed for  anxiety. 10/26/14   Guadalupe Maple, MD  fluticasone (FLONASE) 50 MCG/ACT nasal spray Place 2 sprays into both nostrils daily. 04/01/17   Guadalupe Maple, MD  midodrine (PROAMATINE) 2.5 MG tablet Take 1 tablet (2.5 mg total) by mouth 2 (two) times daily with a meal. Hold if BP >093 systolic 12/14/27   Fritzi Mandes, MD  Multiple Vitamin (MULTIVITAMIN) capsule Take 1 capsule by mouth daily.    [provider]    Allergies Patient has no known allergies.  Family History  Problem Relation Age of Onset  . Stroke Father        passed Jan 2014 age 3  . Cancer Other        colon,breast,ovarian cancers, relationships not listed  . Breast cancer Neg Hx     Social History Social History   Tobacco Use  . Smoking status: Never Smoker  . Smokeless tobacco: Never Used  Substance Use Topics  . Alcohol use: No  . Drug use: No    Review of Systems Constitutional: No fever/chills Eyes: No visual changes. ENT: No sore throat. Cardiovascular: Denies chest pain. Respiratory: Denies shortness of breath. Gastrointestinal: No abdominal pain.  Positive for nausea and vomiting.  Genitourinary: Negative for dysuria. Musculoskeletal: Negative for back pain. Skin: Negative for rash. Neurological: Negative for headaches, focal weakness or numbness.  ____________________________________________   PHYSICAL EXAM:  VITAL SIGNS: ED Triage Vitals [09/27/17 1920]  Enc Vitals Group  BP 139/78     Pulse 121     Resp 24     Temp 97.5     Temp src      SpO2 97     Weight 106 lb (48.1 kg)     Height 5\' 2"  (1.575 m)   Constitutional: Alert and oriented.  Eyes: Conjunctivae are normal.  ENT      Head: Normocephalic and atraumatic.      Nose: No congestion/rhinnorhea.      Mouth/Throat: Mucous membranes are moist.      Neck: No stridor. Hematological/Lymphatic/Immunilogical: No cervical lymphadenopathy. Cardiovascular: Tachycardic, regular rhythm.  No murmurs, rubs, or gallops.   Respiratory: Normal respiratory effort without tachypnea nor retractions. Breath sounds are clear and equal bilaterally. No wheezes/rales/rhonchi. Gastrointestinal: Soft and non tender. Slightly distended. No rebound. No guarding.  Genitourinary: Deferred Musculoskeletal: No lower extremity edema.  Neurologic:  Normal speech and language. No gross focal neurologic deficits are appreciated.  Skin:  Skin is warm, dry and intact. No rash noted. Psychiatric: Mood and affect are normal. Speech and behavior are normal. Patient exhibits appropriate insight and judgment.  ____________________________________________    LABS (pertinent positives/negatives)  Lactic acid 2->2.1 Trop 0.03 CMP na 133, glu 132, cr 1.11, elevation of lfts CBC wbc 10.4, hgb 12.4, plt 355 UA cloudy, negative nitrite, negative leukocytes, 11-20 WBC and squamous epit ____________________________________________   EKG  I, Nance Pear, attending physician, personally viewed and interpreted this EKG  EKG Time: 1928 Rate: 120 Rhythm: sinus tachycardia Axis: normal Intervals: qtc 505 QRS: low voltage ST changes: no st elevation Impression: abnormal ekg   ____________________________________________    RADIOLOGY  CXR No pneumonia  ____________________________________________   PROCEDURES  Procedures  ____________________________________________   INITIAL IMPRESSION / ASSESSMENT AND PLAN / ED COURSE  Pertinent labs & imaging results that were available during my care of the patient were reviewed by me and considered in my medical decision making (see chart for details).   Patient presented to the emergency department today because a couple day history of feeling unwell.  Patient was initially found to be tachycardic.  Differential would be broad including infection secondary to urinary tract infection, gastroenteritis, pneumonia would also consider anemia electrolyte abnormalities because the  patient's symptoms.  Work-up without clear etiology of the patient's symptoms.  Lactic was elevated which was concerning for possible infection or dehydration.  Urine does have some white blood cells however not a clean sample.  However given continued tachycardia and lactic acidosis after IV fluids will plan on admission.  Will start IV ceftriaxone.  ____________________________________________   FINAL CLINICAL IMPRESSION(S) / ED DIAGNOSES  Final diagnoses:  Dehydration  Elevated lactic acid level     Note: This dictation was prepared with Dragon dictation. Any transcriptional errors that result from this process are unintentional     Nance Pear, MD 09/27/17 2344

## 2017-09-27 NOTE — H&P (Signed)
[facility] [place] NAME: Suzanne Jordan    MR#:  [id number]  DATE OF BIRTH:  10/18/1945  DATE OF ADMISSION:  09/27/2017  PRIMARY CARE PHYSICIAN: Guadalupe Maple, MD   REQUESTING/REFERRING PHYSICIAN: Archie Balboa, MD  CHIEF COMPLAINT:   Chief Complaint  Patient presents with  . Weakness  . Nausea    HISTORY OF PRESENT ILLNESS:  Suzanne Jordan  is a 72 y.o. female who presents with 2 days of progressive malaise with nausea and some vomiting.  Patient is unable to contribute much information to HPI beyond that.  Work-up here shows that she meets sirs criteria, but does not elucidate any clear source of infection.  Somewhat questionable UA, though not strongly suspicious for infection.  Patient is persistently tachycardic and very dehydrated, hospitalist were called for admission  PAST MEDICAL HISTORY:   Past Medical History:  Diagnosis Date  . Breast cancer ([place]) 2010   lumpectomy  . Cancer ([place]) 2010   T1c, N0, ER/ PR +. 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   . 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 ABDOMINAL HYSTERECTOMY  1970's  . BREAST LUMPECTOMY Right 2010  . BREAST SURGERY Right 2010   wide excision with sn biopsy  . CATARACT EXTRACTION    . FRACTURE SURGERY Right    right leg, as a child  . TUBAL LIGATION       SOCIAL HISTORY:   Social History   Tobacco Use  . Smoking status: Never Smoker  . Smokeless tobacco: Never Used  Substance Use Topics  . Alcohol use: No     FAMILY HISTORY:   Family History  Problem Relation Age of Onset  . Stroke Father        passed Jan 2014 age 43  . Cancer Other        colon,breast,ovarian cancers, relationships not listed  . Breast cancer Neg Hx      DRUG ALLERGIES:  No Known Allergies  MEDICATIONS AT HOME:   Prior to Admission medications   Medication Sig Start Date End Date Taking?  Authorizing Provider  diazepam (VALIUM) 10 MG tablet Take 1 tablet (10 mg total) by mouth every 12 (twelve) hours as needed for anxiety. 10/26/14  Yes Crissman, Jeannette How, MD  fluticasone (FLONASE) 50 MCG/ACT nasal spray Place 2 sprays into both nostrils daily. 04/01/17  Yes Crissman, Jeannette How, MD  midodrine (PROAMATINE) 2.5 MG tablet Take 1 tablet (2.5 mg total) by mouth 2 (two) times daily with a meal. Hold if BP >935 systolic 10/27/75  Yes Fritzi Mandes, MD  Multiple Vitamin (MULTIVITAMIN) capsule Take 1 capsule by mouth daily.   Yes [provider]    REVIEW OF SYSTEMS:  Review of Systems  Constitutional: Positive for malaise/fatigue. Negative for chills, fever and weight loss.  HENT: Negative for ear pain, hearing loss and tinnitus.   Eyes: Negative for blurred vision, double vision, pain and redness.  Respiratory: Negative for cough, hemoptysis and shortness of breath.   Cardiovascular: Negative for chest pain, palpitations, orthopnea and leg swelling.  Gastrointestinal: Positive for nausea. Negative for abdominal pain, constipation, diarrhea and vomiting.  Genitourinary: Negative for dysuria, frequency and hematuria.  Musculoskeletal: Negative for back pain, joint pain and neck pain.  Skin:       No acne, rash, or lesions  Neurological: Negative for dizziness, tremors, focal  weakness and weakness.  Endo/Heme/Allergies: Negative for polydipsia. Does not bruise/bleed easily.  Psychiatric/Behavioral: Negative for depression. The patient is not nervous/anxious and does not have insomnia.      VITAL SIGNS:   Vitals:   09/27/17 2100 09/27/17 2200 09/27/17 2230 09/27/17 2300  BP: 135/78 111/78 119/78 128/79  Pulse: (!) 119 (!) 118 (!) 119 (!) 116  Resp: (!) 28 (!) 23 (!) 30 (!) 26  Temp:      TempSrc:      SpO2: 98% 98% 99% 98%  Weight:      Height:       Wt Readings from Last 3 Encounters:  09/27/17 48.1 kg (106 lb)  09/21/17 48.2 kg (106 lb 4.8 oz)  09/08/17 47.6 kg (105 lb)     PHYSICAL EXAMINATION:  Physical Exam  Vitals reviewed. Constitutional: She is oriented to person, place, and time. She appears well-developed and well-nourished. No distress.  HENT:  Head: Normocephalic and atraumatic.  Dry mucous membranes  Eyes: Pupils are equal, round, and reactive to light. Conjunctivae and EOM are normal. No scleral icterus.  Neck: Normal range of motion. Neck supple. No JVD present. No thyromegaly present.  Cardiovascular: Regular rhythm and intact distal pulses. Exam reveals no gallop and no friction rub.  No murmur heard. tachycardic  Respiratory: Effort normal and breath sounds normal. No respiratory distress. She has no wheezes. She has no rales.  GI: Soft. Bowel sounds are normal. She exhibits no distension. There is no tenderness.  Musculoskeletal: Normal range of motion. She exhibits no edema.  No arthritis, no gout  Lymphadenopathy:    She has no cervical adenopathy.  Neurological: She is alert and oriented to person, place, and time. No cranial nerve deficit.  No dysarthria, no aphasia  Skin: Skin is warm and dry. No rash noted. No erythema.  Psychiatric: She has a normal mood and affect. Her behavior is normal. Judgment and thought content normal.    LABORATORY PANEL:   CBC Recent Labs  Lab 09/27/17 1927  WBC 10.4  HGB 12.4  HCT 37.0  PLT 355   ------------------------------------------------------------------------------------------------------------------  Chemistries  Recent Labs  Lab 09/27/17 1927  NA 133*  K 4.5  CL 102  CO2 20*  GLUCOSE 132*  BUN 25*  CREATININE 1.11*  CALCIUM 8.8*  AST 126*  ALT 60*  ALKPHOS 552*  BILITOT 2.6*   ------------------------------------------------------------------------------------------------------------------  Cardiac Enzymes Recent Labs  Lab 09/27/17 1927  TROPONINI 0.03*    ------------------------------------------------------------------------------------------------------------------  RADIOLOGY:  Dg Chest 2 View  Result Date: 09/27/2017 CLINICAL DATA:  Weakness and nausea. EXAM: CHEST - 2 VIEW COMPARISON:  09/19/2017 FINDINGS: The cardiomediastinal silhouette is within normal limits. Lung volumes are diminished, more so than on the prior study. There is persistent mild elevation of the right hemidiaphragm. Mild opacity in both lung bases has increased and suggest atelectasis. There are small bilateral pleural effusions. No pneumothorax or acute osseous abnormality is identified. IMPRESSION: Low lung volumes with small pleural effusions and bibasilar atelectasis. Electronically Signed   By: Logan Bores M.D.   On: 09/27/2017 21:27    EKG:   Orders placed or performed during the hospital encounter of 09/27/17  . EKG 12-Lead  . EKG 12-Lead    IMPRESSION AND PLAN:  Principal Problem:   SIRS (systemic inflammatory response syndrome) (HCC) -no clear source of infection at this time.  UA showed some white blood cells, no nitrites no leukoesterase, and had a significant number of squamous epithelial cells confounding the  results.  Suspicion is high for viral gastroenteritis.  We will hydrate the patient tonight monitor for improvement in her blood pressure and lactic acid.  Cultures have been sent.  She was given a dose of Rocephin in the ED empirically. Active Problems:   Hypotension -continue home dose Midrin   Cerebral palsy ([place]) -continue home meds including diazepam  Chart review performed and case discussed with ED provider. Labs, imaging and/or ECG reviewed by provider and discussed with patient/family. Management plans discussed with the patient and/or family.  DVT PROPHYLAXIS: SubQ lovenox  GI PROPHYLAXIS: None  ADMISSION STATUS: Observation  CODE STATUS: Full Code Status History    Date Active Date Inactive Code Status Order ID Comments User  Context   09/20/2017 0007 09/21/2017 1757 Full Code [id number]  Amelia Jo, MD Inpatient      TOTAL TIME TAKING CARE OF THIS PATIENT: 40 minutes.   Haliegh Khurana FIELDING 09/27/2017, 11:10 PM  [employer]  [phone]  CC: Primary care physician; Guadalupe Maple, MD  Note:  This document was prepared using Dragon voice recognition software and may include unintentional dictation errors.

## 2017-09-27 NOTE — ED Triage Notes (Signed)
EMS pt to rm 26 from home with report of weakness, nausea and "just feeling yucky".

## 2017-09-27 NOTE — ED Notes (Signed)
Transport to floor room 123.AS

## 2017-09-28 ENCOUNTER — Other Ambulatory Visit: Payer: Self-pay

## 2017-09-28 ENCOUNTER — Telehealth: Payer: Self-pay | Admitting: Family Medicine

## 2017-09-28 ENCOUNTER — Inpatient Hospital Stay: Payer: Medicare Other

## 2017-09-28 DIAGNOSIS — Z7951 Long term (current) use of inhaled steroids: Secondary | ICD-10-CM | POA: Diagnosis not present

## 2017-09-28 DIAGNOSIS — Z7189 Other specified counseling: Secondary | ICD-10-CM | POA: Diagnosis not present

## 2017-09-28 DIAGNOSIS — R627 Adult failure to thrive: Secondary | ICD-10-CM | POA: Diagnosis not present

## 2017-09-28 DIAGNOSIS — Z66 Do not resuscitate: Secondary | ICD-10-CM | POA: Diagnosis present

## 2017-09-28 DIAGNOSIS — Z9221 Personal history of antineoplastic chemotherapy: Secondary | ICD-10-CM | POA: Diagnosis not present

## 2017-09-28 DIAGNOSIS — Z681 Body mass index (BMI) 19 or less, adult: Secondary | ICD-10-CM

## 2017-09-28 DIAGNOSIS — R64 Cachexia: Secondary | ICD-10-CM | POA: Diagnosis present

## 2017-09-28 DIAGNOSIS — J9811 Atelectasis: Secondary | ICD-10-CM | POA: Diagnosis present

## 2017-09-28 DIAGNOSIS — E860 Dehydration: Secondary | ICD-10-CM | POA: Diagnosis present

## 2017-09-28 DIAGNOSIS — C787 Secondary malignant neoplasm of liver and intrahepatic bile duct: Secondary | ICD-10-CM | POA: Diagnosis present

## 2017-09-28 DIAGNOSIS — E872 Acidosis: Secondary | ICD-10-CM | POA: Diagnosis not present

## 2017-09-28 DIAGNOSIS — Z7401 Bed confinement status: Secondary | ICD-10-CM | POA: Diagnosis not present

## 2017-09-28 DIAGNOSIS — Z515 Encounter for palliative care: Secondary | ICD-10-CM | POA: Diagnosis not present

## 2017-09-28 DIAGNOSIS — R000 Tachycardia, unspecified: Secondary | ICD-10-CM

## 2017-09-28 DIAGNOSIS — R140 Abdominal distension (gaseous): Secondary | ICD-10-CM | POA: Diagnosis not present

## 2017-09-28 DIAGNOSIS — D490 Neoplasm of unspecified behavior of digestive system: Secondary | ICD-10-CM | POA: Diagnosis not present

## 2017-09-28 DIAGNOSIS — C801 Malignant (primary) neoplasm, unspecified: Secondary | ICD-10-CM | POA: Diagnosis present

## 2017-09-28 DIAGNOSIS — C220 Liver cell carcinoma: Secondary | ICD-10-CM | POA: Diagnosis not present

## 2017-09-28 DIAGNOSIS — I10 Essential (primary) hypertension: Secondary | ICD-10-CM | POA: Diagnosis present

## 2017-09-28 DIAGNOSIS — R531 Weakness: Secondary | ICD-10-CM | POA: Diagnosis not present

## 2017-09-28 DIAGNOSIS — Z6822 Body mass index (BMI) 22.0-22.9, adult: Secondary | ICD-10-CM | POA: Diagnosis not present

## 2017-09-28 DIAGNOSIS — F419 Anxiety disorder, unspecified: Secondary | ICD-10-CM | POA: Diagnosis not present

## 2017-09-28 DIAGNOSIS — R180 Malignant ascites: Secondary | ICD-10-CM | POA: Diagnosis present

## 2017-09-28 DIAGNOSIS — R4182 Altered mental status, unspecified: Secondary | ICD-10-CM | POA: Diagnosis not present

## 2017-09-28 DIAGNOSIS — R188 Other ascites: Secondary | ICD-10-CM | POA: Diagnosis not present

## 2017-09-28 DIAGNOSIS — K769 Liver disease, unspecified: Secondary | ICD-10-CM | POA: Diagnosis not present

## 2017-09-28 DIAGNOSIS — Z853 Personal history of malignant neoplasm of breast: Secondary | ICD-10-CM | POA: Diagnosis not present

## 2017-09-28 DIAGNOSIS — I959 Hypotension, unspecified: Secondary | ICD-10-CM | POA: Diagnosis not present

## 2017-09-28 DIAGNOSIS — K598 Other specified functional intestinal disorders: Secondary | ICD-10-CM | POA: Diagnosis not present

## 2017-09-28 DIAGNOSIS — Z923 Personal history of irradiation: Secondary | ICD-10-CM

## 2017-09-28 DIAGNOSIS — I9589 Other hypotension: Secondary | ICD-10-CM | POA: Diagnosis present

## 2017-09-28 DIAGNOSIS — G809 Cerebral palsy, unspecified: Secondary | ICD-10-CM | POA: Diagnosis not present

## 2017-09-28 DIAGNOSIS — Z79899 Other long term (current) drug therapy: Secondary | ICD-10-CM | POA: Diagnosis not present

## 2017-09-28 DIAGNOSIS — Z90710 Acquired absence of both cervix and uterus: Secondary | ICD-10-CM | POA: Diagnosis not present

## 2017-09-28 LAB — CBC
HCT: 37.1 % (ref 35.0–47.0)
Hemoglobin: 12.4 g/dL (ref 12.0–16.0)
MCH: 31.7 pg (ref 26.0–34.0)
MCHC: 33.5 g/dL (ref 32.0–36.0)
MCV: 94.7 fL (ref 80.0–100.0)
PLATELETS: 309 10*3/uL (ref 150–440)
RBC: 3.92 MIL/uL (ref 3.80–5.20)
RDW: 16.9 % — AB (ref 11.5–14.5)
WBC: 9.7 10*3/uL (ref 3.6–11.0)

## 2017-09-28 LAB — BASIC METABOLIC PANEL
Anion gap: 10 (ref 5–15)
BUN: 23 mg/dL — AB (ref 6–20)
CALCIUM: 8 mg/dL — AB (ref 8.9–10.3)
CO2: 17 mmol/L — AB (ref 22–32)
CREATININE: 1.13 mg/dL — AB (ref 0.44–1.00)
Chloride: 109 mmol/L (ref 101–111)
GFR calc non Af Amer: 47 mL/min — ABNORMAL LOW (ref >60)
GFR, EST AFRICAN AMERICAN: 55 mL/min — AB (ref >60)
Glucose, Bld: 113 mg/dL — ABNORMAL HIGH (ref 65–99)
Potassium: 4.4 mmol/L (ref 3.5–5.1)
SODIUM: 136 mmol/L (ref 135–145)

## 2017-09-28 LAB — PROCALCITONIN: Procalcitonin: 0.58 ng/mL

## 2017-09-28 MED ORDER — IOHEXOL 300 MG/ML  SOLN
75.0000 mL | Freq: Once | INTRAMUSCULAR | Status: AC | PRN
  Administered 2017-09-28: 18:00:00 75 mL via INTRAVENOUS

## 2017-09-28 MED ORDER — METOPROLOL TARTRATE 25 MG PO TABS
12.5000 mg | ORAL_TABLET | Freq: Two times a day (BID) | ORAL | Status: DC
  Administered 2017-09-28 – 2017-09-30 (×4): 12.5 mg via ORAL
  Filled 2017-09-28 (×5): qty 1

## 2017-09-28 MED ORDER — MEGESTROL ACETATE 400 MG/10ML PO SUSP
400.0000 mg | Freq: Every day | ORAL | Status: DC
  Administered 2017-09-28 – 2017-10-01 (×4): 400 mg via ORAL
  Filled 2017-09-28 (×5): qty 10

## 2017-09-28 MED ORDER — IOPAMIDOL (ISOVUE-300) INJECTION 61%
15.0000 mL | INTRAVENOUS | Status: AC
  Administered 2017-09-28 (×2): 15 mL via ORAL

## 2017-09-28 NOTE — Clinical Social Work Note (Signed)
Clinical Social Work Assessment  Patient Details  Name: Suzanne Jordan MRN: [id number] Date of Birth: 07/07/1945  Date of referral:  09/28/17               Reason for consult:  Facility Placement                Permission sought to share information with:  Case Manager, Facility Contact Representative, Family Supports Permission granted to share information::  Yes, Verbal Permission Granted  Name::      SNF  Agency::   [facility]   Relationship::     Contact Information:     Housing/Transportation Living arrangements for the past 2 months:  Single Family Home Source of Information:  Patient Patient Interpreter Needed:  None Criminal Activity/Legal Involvement Pertinent to Current Situation/Hospitalization:  No - Comment as needed Significant Relationships:  Adult Children, Friend, Other Family Members Lives with:  Self Do you feel safe going back to the place where you live?  No Need for family participation in patient care:  Yes (Comment)  Care giving concerns:  Patient lives alone    Social Worker assessment / plan:  CSW consulted for possible SNF placement. CSW met with patient and explained role. Patient states that she lives alone and that she has no family that helps her. Per patient there is a friend that has been staying with her. Patient states that she does not feel like she can return home and needs to go to rehab. CSW explained that patient is currently observation and the Medicare 3 night rule. Patient is frustrated that she can not go to a facility.   Patient's cousin Suzanne Jordan [phone], contacted CSW to ask about medicare guidelines. CSW explained the medicare guidelines again. CSW explained that SNF would have to be paid out of pocket if they would like to go that route. Patient's cousin became very agitated and asked CSW to change the observation status. CSW explained that we can not do that. CSW referred cousin to RN CM. CSW offered to  see if any facility would take patient without the 3 night stay and use her Medicaid. Cousin and patient agreed to this. CSW will initiate bed search and give bed offers once received.   Employment status:  Disabled (Comment on whether or not currently receiving Disability) Insurance information:  Medicare PT Recommendations:  Not assessed at this time Information / Referral to community resources:  Skilled Nursing Facility  Patient/Family's Response to care:  Patient is sad that she does not meet criteria for inpatient status so that she can go to SNF   Patient/Family's Understanding of and Emotional Response to Diagnosis, Current Treatment, and Prognosis:  Family is agitated about inpatient status but thanked CSW for assisting them   Emotional Assessment Appearance:  Appears stated age Attitude/Demeanor/Rapport:    Affect (typically observed):  Sad, Agitated Orientation:  Oriented to Self, Oriented to Place, Oriented to  Time Alcohol / Substance use:  Not Applicable Psych involvement (Current and /or in the community):  No (Comment)  Discharge Needs  Concerns to be addressed:  Discharge Planning Concerns Readmission within the last 30 days:  Yes Current discharge risk:  Physical Impairment Barriers to Discharge:  Continued Medical Work up     , LCSWA 09/28/2017, 3:48 PM  

## 2017-09-28 NOTE — Consult Note (Signed)
[facility]  Date of admission:  09/27/2017  Inpatient day:  09/28/2017  Consulting physician: Dr Gladstone Lighter.  Reason for Consultation:  Liver lesions, failure to thrive, h/o breast cancer.  Chief Complaint: Suzanne Jordan is a 72 y.o. female with a distant history of breast cancer and recently documented liver lesions who was admitted through the emergency room with progressive weakness and failure to thrive.  HPI:  The patient has a history of right breast cancer in 10/2008.   She underwent right breast wide excision with sentinel lymph node biopsy on 11/16/2008.  Pathology revealed a 1.5 cm grade II invasive carcinoma of the breast.  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 Four sentinel lymph nodes were negative.  Tumor was ER + (> 90%), PR + (20%), and Her2/neu 2+ (FISH -). Pathologic stage was T1cN0.    Oncotype DX was 16 (low risk).  She received radiation and 5 years of Femara.   She presented with increasing liver function tests.  Liver MRI on 07/09/2017 was severely degraded, nearly non-diagnostic.  The liver was nearly replaced with solid lesions which are favored to represent metastasis.  No primary was seen in the abdomen.  There was an equivocal lesion with the right side of T12.  Tumor markers included:  AFP (3.8), CA15-3 (178.6), CA27.29 (226.1) and CEA (1926).  She was seen in the medical oncology clinic for initial consultation on 09/04/2017.  We discussed a liver biopsy.  Biopsy was scheduled, but cancelled by the patient as there was a conflict in her schedule.  Biopsy was rescheduled.  She states that her family brought her in to the hospital because of her progressive weakness.  She staets that she was spending a lot of time on the couch.  Appetite has been poor. She has had nausea.  She was seen in the ER.  She was initially tachycardic. Lactic acid was elevated.  She received IVF and ceftriaxone.  CXR on 09/27/2017 revealed low lung volumes with small  pleural effusions and bibasilar atelectasis.  Abdomen and pelvis CT today reveals new large volume ascites, dilatation of the small bowel (ileus versus obstruction), and numerous small hypodense liver lesions.  Symptomatically, she is fatigued.   Past Medical History:  Diagnosis Date  . Breast cancer ([place]) 2010   lumpectomy  . Cancer ([place]) 2010   T1c, N0, ER/ PR +. 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   . Cerebral palsy ([place])    at birth    Past Surgical History:  Procedure Laterality Date  . ABDOMINAL HYSTERECTOMY  1970's  . BREAST LUMPECTOMY Right 2010  . BREAST SURGERY Right 2010   wide excision with sn biopsy  . CATARACT EXTRACTION    . FRACTURE SURGERY Right    right leg, as a child  . TUBAL LIGATION      Family History  Problem Relation Age of Onset  . Stroke Father        passed Jan 2014 age 17  . Cancer Other        colon,breast,ovarian cancers, relationships not listed  . Breast cancer Neg Hx     Social History:  reports that she has never smoked. She has never used smokeless tobacco. She reports that she does not drink alcohol or use drugs.  Patient lives alone in [place]. Son stays with her when he is in town. The patient is alone today.  Allergies: No Known Allergies  Medications Prior to Admission  Medication Sig Dispense Refill  .  diazepam (VALIUM) 10 MG tablet Take 1 tablet (10 mg total) by mouth every 12 (twelve) hours as needed for anxiety. 60 tablet 1  . fluticasone (FLONASE) 50 MCG/ACT nasal spray Place 2 sprays into both nostrils daily. 16 g 12  . midodrine (PROAMATINE) 2.5 MG tablet Take 1 tablet (2.5 mg total) by mouth 2 (two) times daily with a meal. Hold if BP >016 systolic 30 tablet 1  . Multiple Vitamin (MULTIVITAMIN) capsule Take 1 capsule by mouth daily.      Review of Systems: GENERAL:  Fatigue.  Minimal activity.  No fevers, sweats or weight loss. PERFORMANCE STATUS (ECOG): 3 HEENT:  No visual changes, runny nose, sore throat, mouth sores  or tenderness. Lungs: No shortness of breath or cough.  No hemoptysis. Cardiac:  No chest pain, palpitations, orthopnea, or PND. GI:  Nausea.  Increasing abdominal girth.  No vomiting, diarrhea, constipation, melena or hematochezia. GU:  No urgency, frequency, dysuria, or hematuria. Musculoskeletal:  Muscle spasticity due to cerebral palsy.  No back pain.  No joint pain.  No muscle tenderness. Extremities:  No pain.  Lower extremity swelling. Skin:  No rashes or skin changes. Neuro:  Generalized weakness.  No use of right arm secondary to cerebral palsy.  No headache. Endocrine:  No diabetes, thyroid issues, hot flashes or night sweats. Psych:  No mood changes, depression or anxiety. Pain:  No focal pain. Review of systems:  All other systems reviewed and found to be negative.  Physical Exam:  Blood pressure 119/79, pulse (!) 109, temperature 97.6 F (36.4 C), temperature source Oral, resp. rate 20, height 5' 2"  (1.575 m), weight 122 lb 14.4 oz (55.7 kg), SpO2 96 %.  GENERAL:  Thin woman sitting comfortably on the medical unit watching [place] [employer] in no acute distress. MENTAL STATUS:  Alert and oriented to person, place and time. HEAD:  Short gray hair.  Normocephalic, atraumatic, face symmetric, no Cushingoid features. EYES:  Blue eyes.  Pupils equal round and reactive to light and accomodation.  No conjunctivitis or scleral icterus. ENT:  Oropharynx clear without lesion.  Tongue normal. Mucous membranes moist.  RESPIRATORY:  Decreased breath sounds at the bases.  Otherwise, clear to auscultation without rales, wheezes or rhonchi. CARDIOVASCULAR:  Regular rate and rhythm without murmur, rub or gallop. ABDOMEN:  Distended.  Shifting dullness.  Soft, non-tender, with active bowel sounds, and no appreciable hepatosplenomegaly.  No masses. SKIN:  Baclofen pump in right lower abdomen.  No rashes, ulcers or lesions. EXTREMITIES: Right arm flexed.  Bilateral 1-2+ lower extremity edema (new).   No skin discoloration or tenderness.  No palpable cords. LYMPH NODES: No palpable cervical, supraclavicular, axillary or inguinal adenopathy  NEUROLOGICAL: Cerebral palsy. PSYCH:  Appropriate.   Results for orders placed or performed during the hospital encounter of 09/27/17 (from the past 48 hour(s))  Urinalysis, Complete w Microscopic     Status: Abnormal   Collection Time: 09/27/17  7:27 PM  Result Value Ref Range   Color, Urine AMBER (A) YELLOW    Comment: BIOCHEMICALS MAY BE AFFECTED BY COLOR   APPearance CLOUDY (A) CLEAR   Specific Gravity, Urine 1.021 1.005 - 1.030   pH 5.0 5.0 - 8.0   Glucose, UA NEGATIVE NEGATIVE mg/dL   Hgb urine dipstick MODERATE (A) NEGATIVE   Bilirubin Urine NEGATIVE NEGATIVE   Ketones, ur NEGATIVE NEGATIVE mg/dL   Protein, ur 100 (A) NEGATIVE mg/dL   Nitrite NEGATIVE NEGATIVE   Leukocytes, UA NEGATIVE NEGATIVE   RBC /  HPF 6-10 0 - 5 RBC/hpf   WBC, UA 11-20 0 - 5 WBC/hpf   Bacteria, UA RARE (A) NONE SEEN   Squamous Epithelial / LPF 11-20 0 - 5   Mucus PRESENT    Trichomonas, UA PRESENT (A) NONE SEEN   Hyaline Casts, UA PRESENT    Granular Casts, UA PRESENT     Comment: Performed at [facility], [address] [place], [place] [address]  Lactic acid, plasma     Status: Abnormal   Collection Time: 09/27/17  7:27 PM  Result Value Ref Range   Lactic Acid, Venous 2.0 (HH) 0.5 - 1.9 mmol/L    Comment: CRITICAL RESULT CALLED TO, READ BACK BY AND VERIFIED WITH ANN CALES AT 2038 09/27/17.PMH Performed at [facility], [address] [place], [place] [address]   CBC with Differential     Status: Abnormal   Collection Time: 09/27/17  7:27 PM  Result Value Ref Range   WBC 10.4 3.6 - 11.0 K/uL   RBC 3.95 3.80 - 5.20 MIL/uL   Hemoglobin 12.4 12.0 - 16.0 g/dL   HCT 37.0 35.0 - 47.0 %   MCV 93.6 80.0 - 100.0 fL   MCH 31.3 26.0 - 34.0 pg   MCHC 33.5 32.0 - 36.0 g/dL   RDW 16.5 (H) 11.5 - 14.5 %   Platelets 355 150 - 440 K/uL    Neutrophils Relative % 64 %   Neutro Abs 6.6 (H) 1.4 - 6.5 K/uL   Lymphocytes Relative 26 %   Lymphs Abs 2.7 1.0 - 3.6 K/uL   Monocytes Relative 10 %   Monocytes Absolute 1.0 (H) 0.2 - 0.9 K/uL   Eosinophils Relative 0 %   Eosinophils Absolute 0.0 0 - 0.7 K/uL   Basophils Relative 0 %   Basophils Absolute 0.0 0 - 0.1 K/uL    Comment: Performed at [facility], [address] [place], [place] [address]  Comprehensive metabolic panel     Status: Abnormal   Collection Time: 09/27/17  7:27 PM  Result Value Ref Range   Sodium 133 (L) 135 - 145 mmol/L   Potassium 4.5 3.5 - 5.1 mmol/L   Chloride 102 101 - 111 mmol/L   CO2 20 (L) 22 - 32 mmol/L   Glucose, Bld 132 (H) 65 - 99 mg/dL   BUN 25 (H) 6 - 20 mg/dL   Creatinine, Ser 1.11 (H) 0.44 - 1.00 mg/dL   Calcium 8.8 (L) 8.9 - 10.3 mg/dL   Total Protein 6.5 6.5 - 8.1 g/dL   Albumin 2.4 (L) 3.5 - 5.0 g/dL   AST 126 (H) 15 - 41 U/L   ALT 60 (H) 14 - 54 U/L   Alkaline Phosphatase 552 (H) 38 - 126 U/L   Total Bilirubin 2.6 (H) 0.3 - 1.2 mg/dL   GFR calc non Af Amer 48 (L) >60 mL/min   GFR calc Af Amer 56 (L) >60 mL/min    Comment: (NOTE) The eGFR has been calculated using the CKD EPI equation. This calculation has not been validated in all clinical situations. eGFR's persistently <60 mL/min signify possible Chronic Kidney Disease.    Anion gap 11 5 - 15    Comment: Performed at [facility], [address] [place], [place] [address]  Troponin I     Status: Abnormal   Collection Time: 09/27/17  7:27 PM  Result Value Ref Range   Troponin I 0.03 (HH) <0.03 ng/mL    Comment: CRITICAL RESULT  CALLED TO, READ BACK BY AND VERIFIED WITH ANN CALES AT 2038 09/27/17.PMH Performed at [facility], [address] [place], [place] [address]   Procalcitonin - Baseline     Status: None   Collection Time: 09/27/17  7:27 PM  Result Value Ref Range   Procalcitonin 0.58 ng/mL    Comment:        Interpretation: PCT > 0.5  ng/mL and <= 2 ng/mL: Systemic infection (sepsis) is possible, but other conditions are known to elevate PCT as well. (NOTE)       Sepsis PCT Algorithm           Lower Respiratory Tract                                      Infection PCT Algorithm    ----------------------------     ----------------------------         PCT < 0.25 ng/mL                PCT < 0.10 ng/mL         Strongly encourage             Strongly discourage   discontinuation of antibiotics    initiation of antibiotics    ----------------------------     -----------------------------       PCT 0.25 - 0.50 ng/mL            PCT 0.10 - 0.25 ng/mL               OR       >80% decrease in PCT            Discourage initiation of                                            antibiotics      Encourage discontinuation           of antibiotics    ----------------------------     -----------------------------         PCT >= 0.50 ng/mL              PCT 0.26 - 0.50 ng/mL                AND       <80% decrease in PCT             Encourage initiation of                                             antibiotics       Encourage continuation           of antibiotics    ----------------------------     -----------------------------        PCT >= 0.50 ng/mL                  PCT > 0.50 ng/mL               AND         increase in PCT                  Strongly encourage  initiation of antibiotics    Strongly encourage escalation           of antibiotics                                     -----------------------------                                           PCT <= 0.25 ng/mL                                                 OR                                        > 80% decrease in PCT                                     Discontinue / Do not initiate                                             antibiotics Performed at [facility], [address] [place], [place] [address]   Lactic acid,  plasma     Status: Abnormal   Collection Time: 09/27/17 10:05 PM  Result Value Ref Range   Lactic Acid, Venous 2.1 (HH) 0.5 - 1.9 mmol/L    Comment: CRITICAL RESULT CALLED TO, READ BACK BY AND VERIFIED WITH ANN CALES AT 2246 09/27/17.PMH Performed at [facility], [address] [place], [place] [address]   Basic metabolic panel     Status: Abnormal   Collection Time: 09/28/17  4:03 AM  Result Value Ref Range   Sodium 136 135 - 145 mmol/L   Potassium 4.4 3.5 - 5.1 mmol/L   Chloride 109 101 - 111 mmol/L   CO2 17 (L) 22 - 32 mmol/L   Glucose, Bld 113 (H) 65 - 99 mg/dL   BUN 23 (H) 6 - 20 mg/dL   Creatinine, Ser 1.13 (H) 0.44 - 1.00 mg/dL   Calcium 8.0 (L) 8.9 - 10.3 mg/dL   GFR calc non Af Amer 47 (L) >60 mL/min   GFR calc Af Amer 55 (L) >60 mL/min    Comment: (NOTE) The eGFR has been calculated using the CKD EPI equation. This calculation has not been validated in all clinical situations. eGFR's persistently <60 mL/min signify possible Chronic Kidney Disease.    Anion gap 10 5 - 15    Comment: Performed at [facility], [address] [place], [place] [address]  CBC     Status: Abnormal   Collection Time: 09/28/17  5:42 AM  Result Value Ref Range   WBC 9.7 3.6 - 11.0 K/uL   RBC 3.92 3.80 - 5.20 MIL/uL   Hemoglobin 12.4 12.0 - 16.0 g/dL   HCT 37.1 35.0 - 47.0 %   MCV 94.7 80.0 - 100.0 fL   MCH  31.7 26.0 - 34.0 pg   MCHC 33.5 32.0 - 36.0 g/dL   RDW 16.9 (H) 11.5 - 14.5 %   Platelets 309 150 - 440 K/uL    Comment: Performed at [facility], [address] [place], [place] [address]   Dg Chest 2 View  Result Date: 09/27/2017 CLINICAL DATA:  Weakness and nausea. EXAM: CHEST - 2 VIEW COMPARISON:  09/19/2017 FINDINGS: The cardiomediastinal silhouette is within normal limits. Lung volumes are diminished, more so than on the prior study. There is persistent mild elevation of the right hemidiaphragm. Mild opacity in both lung bases has increased and  suggest atelectasis. There are small bilateral pleural effusions. No pneumothorax or acute osseous abnormality is identified. IMPRESSION: Low lung volumes with small pleural effusions and bibasilar atelectasis. Electronically Signed   By: Logan Bores M.D.   On: 09/27/2017 21:27   Ct Abdomen Pelvis W Contrast  Result Date: 09/28/2017 CLINICAL DATA:  Abdominal distension. Palsy. Indeterminate lesions within liver identified on MRI 07/09/2017. EXAM: CT ABDOMEN AND PELVIS WITH CONTRAST TECHNIQUE: Multidetector CT imaging of the abdomen and pelvis was performed using the standard protocol following bolus administration of intravenous contrast. CONTRAST:  79m OMNIPAQUE IOHEXOL 300 MG/ML  SOLN COMPARISON:  MRI 07/09/2017 FINDINGS: Lower chest: Moderate bilateral pleural effusions and passive atelectasis. Hepatobiliary: Multiple small hypodense lesions within the liver not changed from comparison MRI abdomen for technique. The liver has a shrunken nodular appears consistent cirrhosis. No biliary duct dilatation. Gallbladder normal. Pancreas: Pancreas is normal. No ductal dilatation. No pancreatic inflammation. Spleen: Normal spleen Adrenals/urinary tract: Adrenal glands and kidneys are normal. The ureters and bladder normal. Stomach/Bowel: Stomach, small bowel, appendix, and cecum are normal. The colon and rectosigmoid colon are normal. Vascular/Lymphatic: Stomach and duodenum normal. The oral contrast progresses slowly through the small bowel. The small bowel is mildly dilated to 3 cm. There is extensive intraperitoneal free fluid within the abdomen and pelvis which likely contributes to mild small bowel wall edema. There is poor progression of the oral contrast. The ascending colon is collapsed. The transverse and descending colon are collapsed. No obstructing lesions identified however there is extensive free fluid which does limit evaluation of the bowel. Reproductive: Post hysterectomy Other: Extensive free fluid.  Streak artifact from the generator pack in the RIGHT anterior abdominal wall. Musculoskeletal: No aggressive osseous lesion. IMPRESSION: 1. New large volume intraperitoneal free fluid suggest ascites of cirrhosis. 2. Dilation of the small bowel and poor progression oral contrast. Differential includes ileus versus bowel obstruction. No clear bowel obstructing lesion identified within the small bowel or colon. 3. Numerous small hypodense liver lesions are similar to comparison MRI on 07/09/2017 at which time metastatic lesions were suspected. Differential includes metastatic disease versus hepatocellular carcinoma versus granulomatous disease. Recommend liver tissue sampling after paracentesis. Electronically Signed   By: SSuzy BouchardM.D.   On: 09/28/2017 18:31    Assessment:  The patient is a 72y.o. woman with a history of stage I right breast cancer (2010) presenting with abnormal liver function tests and liver lesions in 06/2017.  Tumor markers:  AFP (3.8), CA15-3 (178.6), CA27.29 (226.1) and CEA (1926).  She has never had a colonoscopy.  Working diagnosis has been a primary of GI origin.  She was previously scheduled for liver biopsy, but she cancelled.  Abdomen and pelvis CT on 09/28/2017 revealed new large volume ascites, dilatation of the small bowel (ileus versus obstruction), and numerous small hypodense liver lesions.  She has cerebral palsy.  Symptomatically, she is fatigued.  Performance status has declined.  Exam reveals a tense abdomen with ascites.  Plan:   1.  Oncology:  Discussed with patient our prior conversation.  Imaging studies reviewed and reveal progressive disease.  Discuss patient's thoughts about biopsy.  Currently she states that "I do not want to do the biopsy now" as  "I want to feel stronger".  Discussed reconvening tomorrow to readdress. She is still interested in treatment if possible.  Discussed need for tissue diagnosis to direct therapy.  She agreed to discuss  further tomorrow.  She would be a candidate for diagnostic and therapeutic paracentesis.  I am unsure of her medical power of attorney.  She has living will in place.  Prior conversation suggested her code status was DNR/DNI.    Thank you for allowing me to participate in Suzanne Jordan 's care.  I will follow her closely with you while hospitalized and after discharge in the outpatient department.   Lequita Asal, MD  09/28/2017

## 2017-09-28 NOTE — Evaluation (Signed)
Physical Therapy Evaluation Patient Details Name: Suzanne Jordan MRN: [id number] DOB: 06-12-1945 Today's Date: 09/28/2017   History of Present Illness  Pt is a 72 y.o. female admitted for SIRS on 09/27/17. Symptoms included nausea, vomiting, and weakness. Pt reported "feeling yucky". PMH includes cerebral palsy and breast cancer.  Clinical Impression  Prior to hospital admission, pt was modified independent w/ bed mobility and transfers from bed to/from wheelchair (pt reported wheelchair is close to bed, prefers transferring w/ left side leading). Pt lives in a mobile home w/ ramped entrances front and back. She has an aide come to help w/ ADLs and uses a power wheelchair when possible. She has a manual wheelchair that she needs someone else to operate. Currently pt is min assist with supine to sit and requires assistance bringing her trunk forward. Pt is min to mod assist with sit to supine and needs assistance swinging her legs back to the bed. Pt attempted to perform lateral scooting on EOB but was not able any significant distance. When sitting, pt leans posteriorly and requires min assist to maintain balance on EOB. Involuntary R UE movements noted at rest. Pt would benefit from skilled PT to address noted impairments and functional limitations (see below for any additional details).  Upon hospital discharge, recommend pt discharge to SNF.    Follow Up Recommendations SNF    Equipment Recommendations  Wheelchair (measurements PT)    Recommendations for Other Services OT consult     Precautions / Restrictions Precautions Precautions: Fall Restrictions Weight Bearing Restrictions: No      Mobility  Bed Mobility Overal bed mobility: Needs Assistance Bed Mobility: Supine to Sit;Sit to Supine     Supine to sit: Min assist Sit to supine: Mod assist   General bed mobility comments: Pt required assistance to bring trunk forward but swung their own legs to EOB to perform supine to sit;  pt required assistance to swing legs back to bed but was able to lower their trunk on their own during sit to supine. When sitting, pt could scoot themselves a small distance forward and bring legs further off of EOB, but unable to scoot to their left or right.  Transfers                    Ambulation/Gait                Stairs            Wheelchair Mobility    Modified Rankin (Stroke Patients Only)       Balance Overall balance assessment: Needs assistance Sitting-balance support: Single extremity supported Sitting balance-Leahy Scale: Poor Sitting balance - Comments: Pt has posterior lean when sitting on edge of bed, required min assist to keep from tilting further. Postural control: Posterior lean                                   Pertinent Vitals/Pain      Home Living Family/patient expects to be discharged to:: Private residence Living Arrangements: Alone(Son is in and out of home)   Type of Home: Mobile home Home Access: Ramped entrance(ramped entrance front and back of home)     Home Layout: One level Home Equipment: Wheelchair - manual;Wheelchair - power;Shower seat Additional Comments: Pt has bed rails at home to assist with bed mobility    Prior Function Level of Independence: Needs assistance   Gait /  Transfers Assistance Needed: Pt did not ambulate, modified independence w/ bed mobility, transferred w/ modified independence from bed to wheelchair using slide transfer  ADL's / Homemaking Assistance Needed: Pt had assistance w/ ADLs from aide, has people move her when in manual wheelchair, prefers to use power wheelchair when possible        Hand Dominance        Extremity/Trunk Assessment   Upper Extremity Assessment Upper Extremity Assessment: Generalized weakness;RUE deficits/detail(Pt able to grasp fingers w/ R UE weakly. Able to flex elbow w/ AROM. Pt is unable to flex/extend wrist for functional movements w/  AROM, can move through flexion/extension of wrist w/ PROM. ) RUE Deficits / Details: Pt has contractures along R UE which limits shoulder flexion and elbow extension.    Lower Extremity Assessment Lower Extremity Assessment: Generalized weakness;RLE deficits/detail(L hip flexion 4/5, L knee extension 4-/5, L knee flexion 4/5, L dorsiflexion 4/5, L plantarflexion 4-/5; R hip flexion 3+/5, R knee extension 3+/5, R knee flexion 4/5, R dorsiflexion 2/5, R plantarflexion 4+/5) RLE Deficits / Details: Pt has contracture along R LE which limits knee flexion/extension and causes a plantarflexion contracture. R foot dorsiflexion brought to neutral PROM.    Cervical / Trunk Assessment Cervical / Trunk Assessment: Normal  Communication   Communication: No difficulties  Cognition Arousal/Alertness: Awake/alert Behavior During Therapy: WFL for tasks assessed/performed Overall Cognitive Status: Within Functional Limits for tasks assessed                                        General Comments      Exercises     Assessment/Plan    PT Assessment Patient needs continued PT services  PT Problem List Decreased strength;Decreased range of motion;Decreased activity tolerance;Decreased balance;Decreased mobility;Decreased knowledge of precautions       PT Treatment Interventions Functional mobility training;Therapeutic activities;Therapeutic exercise;Balance training;Patient/family education    PT Goals (Current goals can be found in the Care Plan section)  Acute Rehab PT Goals Patient Stated Goal: to increase functional mobility PT Goal Formulation: With patient Time For Goal Achievement: 10/12/17 Potential to Achieve Goals: Good    Frequency Min 2X/week   Barriers to discharge        Co-evaluation               AM-PAC PT "6 Clicks" Daily Activity  Outcome Measure Difficulty turning over in bed (including adjusting bedclothes, sheets and blankets)?:  Unable Difficulty moving from lying on back to sitting on the side of the bed? : Unable Difficulty sitting down on and standing up from a chair with arms (e.g., wheelchair, bedside commode, etc,.)?: Unable Help needed moving to and from a bed to chair (including a wheelchair)?: Total Help needed walking in hospital room?: Total Help needed climbing 3-5 steps with a railing? : Total 6 Click Score: 6    End of Session   Activity Tolerance: Patient tolerated treatment well;Patient limited by fatigue Patient left: in bed;with bed alarm set;with call bell/phone within reach Nurse Communication: Mobility status;Precautions PT Visit Diagnosis: Muscle weakness (generalized) (M62.81);History of falling (Z91.81);Repeated falls (R29.6)    Time: 1308-6578 PT Time Calculation (min) (ACUTE ONLY): 28 min   Charges:         PT G CodesChana Bode, SPT 09/28/17, 5:01 PM

## 2017-09-28 NOTE — Telephone Encounter (Signed)
OK 

## 2017-09-28 NOTE — Care Management Obs Status (Signed)
[facility] NOTIFICATION   Patient Details  Name: Suzanne Jordan MRN: [id number] Date of Birth: January 12, 1946   Medicare Observation Status Notification Given:  Yes    Jolly Mango, RN 09/28/2017, 2:06 PM

## 2017-09-28 NOTE — Plan of Care (Signed)
  Problem: Respiratory: Goal: Ability to maintain adequate ventilation will improve Outcome: Progressing   Problem: Education: Goal: Knowledge of General Education information will improve Outcome: Progressing   Problem: Health Behavior/Discharge Planning: Goal: Ability to manage health-related needs will improve Outcome: Progressing   Problem: Safety: Goal: Ability to remain free from injury will improve Outcome: Progressing   Problem: Skin Integrity: Goal: Risk for impaired skin integrity will decrease Outcome: Progressing

## 2017-09-28 NOTE — Care Management (Signed)
[facility] to [facility] for [facility]. She has [facility] services 5 days a week 3 hours per day. Patient reports she lives with her son. She also reports a fall last week. She has 2 wheelchairs, one standard and one electric. Patient not happy she cant go to SNF. PCP is Dr. Jeananne Rama.

## 2017-09-28 NOTE — Telephone Encounter (Signed)
Copied from [place] [id number]. Topic: Quick Communication - See Telephone Encounter >> Sep 28, 2017 12:04 PM Boyd Kerbs wrote: CRM for notification. See Telephone encounter for: 09/28/17.  Pt. Is in the hospital, [facility]- not doing well at all.    Cousin called about this and asked about a liver Biopsy.

## 2017-09-28 NOTE — Progress Notes (Signed)
[facility] [place] NAME: Suzanne Jordan    MR#:  [id number]  DATE OF BIRTH:  01-Aug-1945  SUBJECTIVE:  CHIEF COMPLAINT:   Chief Complaint  Patient presents with  . Weakness  . Nausea   -Very cachectic appearing, abd is distended - weight loss, poor intake   REVIEW OF SYSTEMS:  Review of Systems  Constitutional: Positive for malaise/fatigue. Negative for chills and fever.  HENT: Negative for congestion, ear discharge, hearing loss and nosebleeds.   Eyes: Negative for blurred vision and double vision.  Respiratory: Negative for cough, shortness of breath and wheezing.   Cardiovascular: Positive for leg swelling. Negative for chest pain and palpitations.  Gastrointestinal: Positive for abdominal pain. Negative for constipation, diarrhea, nausea and vomiting.  Genitourinary: Negative for dysuria.  Musculoskeletal: Negative for myalgias.  Neurological: Negative for dizziness, focal weakness, seizures, weakness and headaches.  Psychiatric/Behavioral: Negative for depression.    DRUG ALLERGIES:  No Known Allergies  VITALS:  Blood pressure 123/79, pulse 99, temperature 97.8 F (36.6 C), temperature source Oral, resp. rate 20, height 5\' 2"  (1.575 m), weight 55.7 kg (122 lb 14.4 oz), SpO2 96 %.  PHYSICAL EXAMINATION:  Physical Exam  GENERAL:  72 y.o.-year-old ill nourished patient lying in the bed with no acute distress.  EYES: Pupils equal, round, reactive to light and accommodation. No scleral icterus. Extraocular muscles intact.  HEENT: Head atraumatic, normocephalic. Oropharynx and nasopharynx clear.  NECK:  Supple, no jugular venous distention. No thyroid enlargement, no tenderness.  LUNGS: Normal breath sounds bilaterally, no wheezing, rales,rhonchi or crepitation. No use of accessory muscles of respiration. Decreased bibasilar breath sounds CARDIOVASCULAR: S1, S2 normal. No rubs, or gallops. 2/6 systolic murmur present ABDOMEN:  Soft, nontender, nondistended. Bowel sounds present. No organomegaly or mass.  EXTREMITIES: No pedal edema, cyanosis, or clubbing.  NEUROLOGIC: Cranial nerves II through XII are intact. Spastic in all extremities especially right sided. Sensation intact. Gait not checked. Global weakness PSYCHIATRIC: The patient is alert and oriented x 3.  SKIN: No obvious rash, lesion, or ulcer.    LABORATORY PANEL:   CBC Recent Labs  Lab 09/28/17 0542  WBC 9.7  HGB 12.4  HCT 37.1  PLT 309   ------------------------------------------------------------------------------------------------------------------  Chemistries  Recent Labs  Lab 09/27/17 1927 09/28/17 0403  NA 133* 136  K 4.5 4.4  CL 102 109  CO2 20* 17*  GLUCOSE 132* 113*  BUN 25* 23*  CREATININE 1.11* 1.13*  CALCIUM 8.8* 8.0*  AST 126*  --   ALT 60*  --   ALKPHOS 552*  --   BILITOT 2.6*  --    ------------------------------------------------------------------------------------------------------------------  Cardiac Enzymes Recent Labs  Lab 09/27/17 1927  TROPONINI 0.03*   ------------------------------------------------------------------------------------------------------------------  RADIOLOGY:  Dg Chest 2 View  Result Date: 09/27/2017 CLINICAL DATA:  Weakness and nausea. EXAM: CHEST - 2 VIEW COMPARISON:  09/19/2017 FINDINGS: The cardiomediastinal silhouette is within normal limits. Lung volumes are diminished, more so than on the prior study. There is persistent mild elevation of the right hemidiaphragm. Mild opacity in both lung bases has increased and suggest atelectasis. There are small bilateral pleural effusions. No pneumothorax or acute osseous abnormality is identified. IMPRESSION: Low lung volumes with small pleural effusions and bibasilar atelectasis. Electronically Signed   By: Logan Bores M.D.   On: 09/27/2017 21:27    EKG:   Orders placed or performed during the hospital encounter of 09/27/17  . EKG  12-Lead  . EKG 12-Lead  ASSESSMENT AND PLAN:   72 year old female with past medical history significant for history of breast cancer, hypertension, cerebral palsy brought from home due to weakness, malaise  1. Weakness, failure to thrive- ? Concern for worsening underlying for underlying malignancy -Recent liver ultrasound showing some liver masses.  No further biopsy or work-up was done. -Since abdomen is very distended at this time, will get a CT of the abdomen. -Oncology will be consulted in case patient needs any biopsy and also for prognostic purposes. -Physical therapy consult  2.  Tachycardia-sinus tachycardia.  On metoprolol  3.  Chronic hypotension-continue low-dose Midodrine  4.  DVT prophylaxis-on Lovenox     All the records are reviewed and case discussed with Care Management/Social Workerr. Management plans discussed with the patient, family and they are in agreement.  CODE STATUS: Full Code  TOTAL TIME TAKING CARE OF THIS PATIENT: 38 minutes.   POSSIBLE D/C IN 2 DAYS, DEPENDING ON CLINICAL CONDITION.   Gladstone Lighter M.D on 09/28/2017 at 1:39 PM  Between 7am to 6pm - Pager - [phone]  After 6pm go to [email or web address] - password [email or web address] [facility]  Office  [phone]  CC: Primary care physician; Guadalupe Maple, MD

## 2017-09-28 NOTE — Progress Notes (Signed)
Received call from family member who did not have the password.  Explained to her that I could not give any information out without the password.  Family member requesting that pt get a bath and her hair washed.  Spoke with pt and she is aware of the password and that she needs to give it to her family.  Suggested a bath and hair wash to pt but pt declined. She stated that she was too tired and wanted to wait until morning.  Explained to pt that I had time now to give her a bath and that she might sleep better after a bath.  Pt declined again.  Primary RN Claiborne Billings updated. Dorna Bloom RN

## 2017-09-28 NOTE — Telephone Encounter (Signed)
Message relayed to patient. Verbalized understanding and denied questions.   

## 2017-09-28 NOTE — Telephone Encounter (Signed)
[facility] should be able to answer these questions.

## 2017-09-28 NOTE — Telephone Encounter (Signed)
Do not have DPR for Cousin, Cannot discuss results. However I cannot find where we have anything to do with this as it appears to have been ordered by cancer center. Please advise.

## 2017-09-28 NOTE — Progress Notes (Signed)
SWOT review for MEWS score of 3. Patient with HR 123 and last documented respirations of 32. Metoprolol given HR now 99. Primary RN to collect respirations at this time. IVF infusing.

## 2017-09-28 NOTE — Telephone Encounter (Signed)
Left message on machine for pt to return call to the office.  

## 2017-09-28 NOTE — NC FL2 (Signed)
[place] LEVEL OF CARE SCREENING TOOL     IDENTIFICATION  Patient Name: Suzanne Jordan Birthdate: August 01, 1945 Sex: female Admission Date (Current Location): 09/27/2017  [place] and [place] Number:  Engineering geologist and Address:  [facility], [address], [place], [place] [address]      Provider Number: [id number]  Attending Physician Name and Address:  Gladstone Lighter, MD  Relative Name and Phone Number:       Current Level of Care: Hospital Recommended Level of Care: [place] Prior Approval Number:    Date Approved/Denied:   PASRR Number: [id number]  Discharge Plan: SNF    Current Diagnoses: Patient Active Problem List   Diagnosis Date Noted  . SIRS (systemic inflammatory response syndrome) ([place]) 09/27/2017  . Syncope 09/19/2017  . Goals of care, counseling/discussion 09/04/2017  . Liver masses 08/26/2017  . [facility] care planning/counseling discussion 04/01/2017  . History of breast cancer 07/17/2015  . Essential hypertension 01/24/2015  . Cerebral palsy ([place]) 01/24/2015  . Malignant neoplasm of upper-outer quadrant of female breast ([place])     Orientation RESPIRATION BLADDER Height & Weight     Self, Time, Place, Situation  Normal Incontinent Weight: 122 lb 14.4 oz (55.7 kg) Height:  5\' 2"  (157.5 cm)  BEHAVIORAL SYMPTOMS/MOOD NEUROLOGICAL BOWEL NUTRITION STATUS  (None) (None) Incontinent Diet(Heart Healthy )  AMBULATORY STATUS COMMUNICATION OF NEEDS Skin   Extensive Assist Verbally Normal                       Personal Care Assistance Level of Assistance  Bathing, Feeding, Dressing Bathing Assistance: Limited assistance Feeding assistance: Independent Dressing Assistance: Limited assistance     Functional Limitations Info  Sight, Hearing, Speech Sight Info: Adequate Hearing Info: Adequate Speech Info: Adequate    SPECIAL CARE FACTORS FREQUENCY  PT (By licensed PT), OT (By  licensed OT)                    Contractures Contractures Info: Present(Right side )    Additional Factors Info  Code Status, Allergies Code Status Info: Full Code  Allergies Info: NKA           Current Medications (09/28/2017):  This is the current hospital active medication list Current Facility-Administered Medications  Medication Dose Route Frequency Provider Last Rate Last Dose  . acetaminophen (TYLENOL) tablet 650 mg  650 mg Oral Q6H PRN Lance Coon, MD       Or  . acetaminophen (TYLENOL) suppository 650 mg  650 mg Rectal Q6H PRN Lance Coon, MD      . diazepam (VALIUM) tablet 10 mg  10 mg Oral Q12H PRN Lance Coon, MD      . enoxaparin (LOVENOX) injection 40 mg  40 mg Subcutaneous Q24H Lance Coon, MD   40 mg at 09/28/17 0854  . iopamidol (ISOVUE-300) 61 % injection 15 mL  15 mL Oral Q1 Hr x 2 Gladstone Lighter, MD   15 mL at 09/28/17 1445  . megestrol (MEGACE) 400 MG/10ML suspension 400 mg  400 mg Oral Daily Gladstone Lighter, MD      . metoprolol tartrate (LOPRESSOR) tablet 12.5 mg  12.5 mg Oral BID Gladstone Lighter, MD   12.5 mg at 09/28/17 0940  . midodrine (PROAMATINE) tablet 2.5 mg  2.5 mg Oral BID WC Lance Coon, MD   2.5 mg at 09/28/17 0854  . ondansetron (ZOFRAN) tablet 4 mg  4 mg Oral Q6H PRN Jannifer Franklin,  Shanon Brow, MD       Or  . ondansetron [facility]) injection 4 mg  4 mg Intravenous Q6H PRN Lance Coon, MD         Discharge Medications: Please see discharge summary for a list of discharge medications.  Relevant Imaging Results:  Relevant Lab Results:   Additional Information SSN [id number]  Annamaria Boots, [place]

## 2017-09-28 NOTE — Telephone Encounter (Signed)
Copied from [place] [id number]. Topic: Quick Communication - See Telephone Encounter >> Sep 28, 2017  9:13 AM Percell Belt A wrote: CRM for notification. See Telephone encounter for: 09/28/17. Jeani Hawking with [facility] [phone] [place] for PT and OT  3 week 2 2 week 2

## 2017-09-29 ENCOUNTER — Inpatient Hospital Stay: Payer: Medicare Other

## 2017-09-29 ENCOUNTER — Other Ambulatory Visit: Payer: Self-pay | Admitting: *Deleted

## 2017-09-29 LAB — GLUCOSE, PLEURAL OR PERITONEAL FLUID: GLUCOSE FL: 116 mg/dL

## 2017-09-29 LAB — HEPATIC FUNCTION PANEL
ALT: 56 U/L — ABNORMAL HIGH (ref 14–54)
AST: 103 U/L — ABNORMAL HIGH (ref 15–41)
Albumin: 2.4 g/dL — ABNORMAL LOW (ref 3.5–5.0)
Alkaline Phosphatase: 497 U/L — ABNORMAL HIGH (ref 38–126)
Bilirubin, Direct: 1.3 mg/dL — ABNORMAL HIGH (ref 0.1–0.5)
Indirect Bilirubin: 1.2 mg/dL — ABNORMAL HIGH (ref 0.3–0.9)
Total Bilirubin: 2.5 mg/dL — ABNORMAL HIGH (ref 0.3–1.2)
Total Protein: 6.2 g/dL — ABNORMAL LOW (ref 6.5–8.1)

## 2017-09-29 LAB — URINE CULTURE: Culture: NO GROWTH

## 2017-09-29 LAB — LACTATE DEHYDROGENASE, PLEURAL OR PERITONEAL FLUID: LD, Fluid: 48 U/L — ABNORMAL HIGH (ref 3–23)

## 2017-09-29 LAB — AMYLASE, PLEURAL OR PERITONEAL FLUID: AMYLASE FL: 55 U/L

## 2017-09-29 LAB — BODY FLUID CELL COUNT WITH DIFFERENTIAL
Eos, Fluid: 0 %
LYMPHS FL: 48 %
Monocyte-Macrophage-Serous Fluid: 19 %
NEUTROPHIL FLUID: 33 %
Total Nucleated Cell Count, Fluid: 81 cu mm

## 2017-09-29 LAB — PROTEIN, PLEURAL OR PERITONEAL FLUID: Total protein, fluid: <3 g/dL

## 2017-09-29 LAB — ALBUMIN, PLEURAL OR PERITONEAL FLUID

## 2017-09-29 MED ORDER — ALBUMIN HUMAN 25 % IV SOLN
25.0000 g | Freq: Once | INTRAVENOUS | Status: AC
  Administered 2017-09-29: 25 g via INTRAVENOUS
  Filled 2017-09-29: qty 100

## 2017-09-29 MED ORDER — ENSURE ENLIVE PO LIQD
237.0000 mL | Freq: Three times a day (TID) | ORAL | Status: DC
  Administered 2017-09-30 – 2017-10-01 (×5): 237 mL via ORAL

## 2017-09-29 NOTE — Progress Notes (Signed)
Clinical Social Worker (CSW) discussed case with MD. Per MD patient will be at [facility] for at least 3 nights. CSW met with patient and made her aware that she has changed to inpatient on 09/28/17. CSW explained that medicare will pay for SNF once she meets the 3 night stay criteria. Patient appeared happy to hear this and requested [facility]. Patient also stated that she does not have a HPOA and her son is in jail. Patient reported that she wanted her cousin Holley Raring to be her HPOA. Chaplain consult placed for HPOA. CSW contacted patient's cousin Holley Raring and made her aware of above. Per Holley Raring her mother is in [place] and she would be glad to be patient's HPOA.   CSW presented bed offers to patient and [name]. They chose [facility]. Plan is for patient to D/C to [place] Thursday 10/01/17 pending medical clearance. [facility] admissions coordinator at [facility] is aware of above.   [employer], LCSW [phone]

## 2017-09-29 NOTE — Progress Notes (Addendum)
[facility] NAME: Suzanne Jordan    MR#:  [id number]  DATE OF BIRTH:  Oct 10, 1945  SUBJECTIVE:  CHIEF COMPLAINT:   Chief Complaint  Patient presents with  . Weakness  . Nausea   - feels about the same, abd distended - for paracentesis today  REVIEW OF SYSTEMS:  Review of Systems  Constitutional: Positive for malaise/fatigue. Negative for chills and fever.  HENT: Negative for congestion, ear discharge, hearing loss and nosebleeds.   Eyes: Negative for blurred vision and double vision.  Respiratory: Negative for cough, shortness of breath and wheezing.   Cardiovascular: Positive for leg swelling. Negative for chest pain and palpitations.  Gastrointestinal: Positive for abdominal pain. Negative for constipation, diarrhea, nausea and vomiting.  Genitourinary: Negative for dysuria.  Musculoskeletal: Negative for myalgias.  Neurological: Negative for dizziness, focal weakness, seizures, weakness and headaches.  Psychiatric/Behavioral: Negative for depression.    DRUG ALLERGIES:  No Known Allergies  VITALS:  Blood pressure 120/67, pulse 96, temperature (!) 97.4 F (36.3 C), temperature source Oral, resp. rate 20, height 5\' 2"  (1.575 m), weight 55.7 kg (122 lb 14.4 oz), SpO2 99 %.  PHYSICAL EXAMINATION:  Physical Exam  GENERAL:  72 y.o.-year-old ill nourished patient lying in the bed with no acute distress.  EYES: Pupils equal, round, reactive to light and accommodation. No scleral icterus. Extraocular muscles intact.  HEENT: Head atraumatic, normocephalic. Oropharynx and nasopharynx clear.  NECK:  Supple, no jugular venous distention. No thyroid enlargement, no tenderness.  LUNGS: Normal breath sounds bilaterally, no wheezing, rales,rhonchi or crepitation. No use of accessory muscles of respiration. Decreased bibasilar breath sounds CARDIOVASCULAR: S1, S2 normal. No rubs, or gallops. 2/6 systolic murmur present ABDOMEN: Soft,  nontender,  Very distended. Bowel sounds present. No organomegaly or mass.  Baclofen pump in lower abdomen EXTREMITIES: No pedal edema, cyanosis, or clubbing.  NEUROLOGIC: Cranial nerves II through XII are intact. Spastic in all extremities especially right sided. Sensation intact. Gait not checked. Global weakness PSYCHIATRIC: The patient is alert and oriented x 3.  SKIN: No obvious rash, lesion, or ulcer.    LABORATORY PANEL:   CBC Recent Labs  Lab 09/28/17 0542  WBC 9.7  HGB 12.4  HCT 37.1  PLT 309   ------------------------------------------------------------------------------------------------------------------  Chemistries  Recent Labs  Lab 09/28/17 0403 09/29/17 1022  NA 136  --   K 4.4  --   CL 109  --   CO2 17*  --   GLUCOSE 113*  --   BUN 23*  --   CREATININE 1.13*  --   CALCIUM 8.0*  --   AST  --  103*  ALT  --  56*  ALKPHOS  --  497*  BILITOT  --  2.5*   ------------------------------------------------------------------------------------------------------------------  Cardiac Enzymes Recent Labs  Lab 09/27/17 1927  TROPONINI 0.03*   ------------------------------------------------------------------------------------------------------------------  RADIOLOGY:  Dg Chest 2 View  Result Date: 09/27/2017 CLINICAL DATA:  Weakness and nausea. EXAM: CHEST - 2 VIEW COMPARISON:  09/19/2017 FINDINGS: The cardiomediastinal silhouette is within normal limits. Lung volumes are diminished, more so than on the prior study. There is persistent mild elevation of the right hemidiaphragm. Mild opacity in both lung bases has increased and suggest atelectasis. There are small bilateral pleural effusions. No pneumothorax or acute osseous abnormality is identified. IMPRESSION: Low lung volumes with small pleural effusions and bibasilar atelectasis. Electronically Signed   By: Logan Bores M.D.   On: 09/27/2017 21:27  Ct Abdomen Pelvis W Contrast  Result Date:  09/28/2017 CLINICAL DATA:  Abdominal distension. Palsy. Indeterminate lesions within liver identified on MRI 07/09/2017. EXAM: CT ABDOMEN AND PELVIS WITH CONTRAST TECHNIQUE: Multidetector CT imaging of the abdomen and pelvis was performed using the standard protocol following bolus administration of intravenous contrast. CONTRAST:  74mL OMNIPAQUE IOHEXOL 300 MG/ML  SOLN COMPARISON:  MRI 07/09/2017 FINDINGS: Lower chest: Moderate bilateral pleural effusions and passive atelectasis. Hepatobiliary: Multiple small hypodense lesions within the liver not changed from comparison MRI abdomen for technique. The liver has a shrunken nodular appears consistent cirrhosis. No biliary duct dilatation. Gallbladder normal. Pancreas: Pancreas is normal. No ductal dilatation. No pancreatic inflammation. Spleen: Normal spleen Adrenals/urinary tract: Adrenal glands and kidneys are normal. The ureters and bladder normal. Stomach/Bowel: Stomach, small bowel, appendix, and cecum are normal. The colon and rectosigmoid colon are normal. Vascular/Lymphatic: Stomach and duodenum normal. The oral contrast progresses slowly through the small bowel. The small bowel is mildly dilated to 3 cm. There is extensive intraperitoneal free fluid within the abdomen and pelvis which likely contributes to mild small bowel wall edema. There is poor progression of the oral contrast. The ascending colon is collapsed. The transverse and descending colon are collapsed. No obstructing lesions identified however there is extensive free fluid which does limit evaluation of the bowel. Reproductive: Post hysterectomy Other: Extensive free fluid. Streak artifact from the generator pack in the RIGHT anterior abdominal wall. Musculoskeletal: No aggressive osseous lesion. IMPRESSION: 1. New large volume intraperitoneal free fluid suggest ascites of cirrhosis. 2. Dilation of the small bowel and poor progression oral contrast. Differential includes ileus versus bowel  obstruction. No clear bowel obstructing lesion identified within the small bowel or colon. 3. Numerous small hypodense liver lesions are similar to comparison MRI on 07/09/2017 at which time metastatic lesions were suspected. Differential includes metastatic disease versus hepatocellular carcinoma versus granulomatous disease. Recommend liver tissue sampling after paracentesis. Electronically Signed   By: Suzy Bouchard M.D.   On: 09/28/2017 18:31   US Paracentesis  Result Date: 09/29/2017 INDICATION: Ascites EXAM: ULTRASOUND GUIDED PARACENTESIS MEDICATIONS: None. COMPLICATIONS: None immediate. PROCEDURE: Informed written consent was obtained from the patient after a discussion of the risks, benefits and alternatives to treatment. A timeout was performed prior to the initiation of the procedure. Initial ultrasound scanning demonstrates a large amount of ascites within the right lower abdominal quadrant. The right lower abdomen was prepped and draped in the usual sterile fashion. 1% lidocaine with epinephrine was used for local anesthesia. Following this, a 6 Fr Safe-T-Centesis catheter was introduced. An ultrasound image was saved for documentation purposes. The paracentesis was performed. The catheter was removed and a dressing was applied. The patient tolerated the procedure well without immediate post procedural complication. FINDINGS: A total of approximately 5.45 L of clear yellow fluid was removed. Samples were sent to the laboratory as requested by the clinical team. IMPRESSION: Successful ultrasound-guided paracentesis yielding 5.45 liters of peritoneal fluid. Electronically Signed   By: Inez Catalina M.D.   On: 09/29/2017 13:33    EKG:   Orders placed or performed during the hospital encounter of 09/27/17  . EKG 12-Lead  . EKG 12-Lead    ASSESSMENT AND PLAN:   72 year old female with past medical history significant for history of breast cancer, hypertension, cerebral palsy brought from home  due to weakness, malaise  1. Weakness, failure to thrive- ? Concern for worsening underlying malignancy -Recent liver ultrasound showing some liver masses.  No further biopsy or  work-up was done. -CT abd done yesterday showing significant ascites and multiple liver lesions - oncology consulted - paracentesis requested - cytology to be sent for - palliative care consult -Physical therapy consult - megace added for appetite  2.  Tachycardia-sinus tachycardia.  On metoprolol  3.  Chronic hypotension-continue low-dose Midodrine  4.  DVT prophylaxis-on Lovenox   Discussed plan with patients' cousin Holley Raring, patients wants to name Holley Raring as her HCPOA Chaplain to notarize it.  Patient will need rehab at discharge    All the records are reviewed and case discussed with Care Management/Social Workerr. Management plans discussed with the patient, family and they are in agreement.  CODE STATUS: Full Code  TOTAL TIME TAKING CARE OF THIS PATIENT: 38 minutes.   POSSIBLE D/C IN 2 DAYS, DEPENDING ON CLINICAL CONDITION.   Gladstone Lighter M.D on 09/29/2017 at 2:30 PM  Between 7am to 6pm - Pager - [phone]  After 6pm go to [email or web address] - password [email or web address] [facility]  Office  [phone]  CC: Primary care physician; Guadalupe Maple, MD

## 2017-09-29 NOTE — Patient Outreach (Signed)
[facility]) Care Management  09/29/2017  RALIYAH MONTELLA 1946/02/07 [id number]  Referral via [facility]-EMMI-General Discharge: Per chart review- Patient is currently inpatient status at [facility]: [facility] EMMI-Discharge calls. [facility] Liaison of patient admission. Close case.   Sherrin Daisy, RN BSN [place] Management Coordinator [facility] Care Management  [phone]

## 2017-09-29 NOTE — Clinical Social Work Placement (Signed)
   CLINICAL SOCIAL WORK PLACEMENT  NOTE  Date:  09/29/2017  Patient Details  Name: SYDNIE SIGMUND MRN: [id number] Date of Birth: 1946/04/14  Clinical Social Work is seeking post-discharge placement for this patient at the [place] level of care (*CSW will initial, date and re-position this form in  chart as items are completed):  Yes   Patient/family provided with [facility] Work Department's list of facilities offering this level of care within the geographic area requested by the patient (or if unable, by the patient's family).  Yes   Patient/family informed of their freedom to choose among providers that offer the needed level of care, that participate in Medicare, Medicaid or managed care program needed by the patient, have an available bed and are willing to accept the patient.  Yes   Patient/family informed of [facility]'s ownership interest in [facility] and [facility], as well as of the fact that they are under no obligation to receive care at these facilities.  PASRR submitted to EDS on 09/28/17     PASRR number received on 09/28/17     Existing PASRR number confirmed on       FL2 transmitted to all facilities in geographic area requested by pt/family on 09/28/17     FL2 transmitted to all facilities within larger geographic area on       Patient informed that his/her managed care company has contracts with or will negotiate with certain facilities, including the following:        Yes   Patient/family informed of bed offers received.  Patient chooses bed at [facility] )     Physician recommends and patient chooses bed at      Patient to be transferred to   on  .  Patient to be transferred to facility by       Patient family notified on   of transfer.  Name of family member notified:        PHYSICIAN       Additional Comment:    _______________________________________________ Kloie Whiting, Veronia Beets,  LCSW 09/29/2017, 2:12 PM

## 2017-09-29 NOTE — Progress Notes (Signed)
   09/29/17 1545  Clinical Encounter Type  Visited With Patient and family together  Visit Type Follow-up  Referral From Nurse  Consult/Referral To Chaplain  Spiritual Encounters  Spiritual Needs Other (Comment)   [place] completed Ad with PT. Copy was put into PT's chart and original was given to PT.

## 2017-09-29 NOTE — Procedures (Signed)
US paracentesis without difficulty  Complications:  None  Blood Loss: none  See dictation in canopy pacs  

## 2017-09-29 NOTE — Progress Notes (Signed)
   09/29/17 1250  Clinical Encounter Type  Visited With Patient  Visit Type Initial  Referral From Nurse  Consult/Referral To Chaplain  Spiritual Encounters  Spiritual Needs Brochure   [facility] received an OR to assist PT on changing AD. [place] left paperwork with PT to be completed when able. PT was instructed to have [facility] paged when she was ready to complete AD.

## 2017-09-29 NOTE — Progress Notes (Signed)
Initial Nutrition Assessment  DOCUMENTATION CODES:   Severe malnutrition in context of chronic illness  INTERVENTION:  Recommend liberalizing diet to 2 gram sodium. Depending on goals of care, can consider further liberalization to regular diet.  Provide Ensure Enlive po TID, each supplement provides 350 kcal and 20 grams of protein.   NUTRITION DIAGNOSIS:   Severe Malnutrition related to chronic illness(hx breast cancer, ascites and liver lesions concerning for malignancy) as evidenced by severe fat depletion, severe muscle depletion.  GOAL:   Patient will meet greater than or equal to 90% of their needs  MONITOR:   PO intake, Supplement acceptance, Labs, Weight trends, Skin, I & O's  REASON FOR ASSESSMENT:   Consult Assessment of nutrition requirement/status  ASSESSMENT:   72 year old female with PMHx of cerebral palsy, breast cancer s/p lumpectomy in 2010 admitted with weakness, FTT, found to have ascites and multiple liver lesions concerning for malignancy.   -Patient s/p US paracentesis today (5.45 L of clear yellow fluid was removed).  Met with patient at bedside. She reports her appetite has been poor for a while now, but she is unsure how long. At home she does not cook much anymore since her husband has passed away. She may have "one good meal" daily and then a snack. She reports her meal is typically just vegetables as she does not eat a lot of meat or other sources of protein. She reports her cousin is coming by later.  Patient reports she has "always been small." Noted in chart she is typically 105-110 lbs. She was 106.3 lbs on 09/21/2017 (48.2 kg). Current weight likely falsely elevated.  Medications reviewed and include: Megace 400 mg daily. IV albumin infusing at time of RD assessment.  Labs reviewed: CO2 17, BUN 23, Creatinine 1.13, Alk Phos 497, AST 103, ALT 56, D Bili 1.3, T Bili 2.5.  NUTRITION - FOCUSED PHYSICAL EXAM:    Most Recent Value  Orbital  Region  Severe depletion  Upper Arm Region  Severe depletion  Thoracic and Lumbar Region  Unable to assess  Buccal Region  Severe depletion  Temple Region  Severe depletion  Clavicle Bone Region  Severe depletion  Clavicle and Acromion Bone Region  Severe depletion  Scapular Bone Region  Severe depletion  Dorsal Hand  Severe depletion  Patellar Region  Unable to assess  Anterior Thigh Region  Unable to assess  Posterior Calf Region  Unable to assess  Edema (RD Assessment)  Severe [b/l lower extremities,  abdomen still slightly distended]  Hair  Reviewed  Eyes  Reviewed  Mouth  Reviewed  Skin  Reviewed  Nails  Reviewed     Diet Order:   Diet Order           Diet Heart Room service appropriate? Yes; Fluid consistency: Thin  Diet effective now          EDUCATION NEEDS:   No education needs have been identified at this time  Skin:  Skin Assessment: Reviewed RN Assessment  Last BM:  09/29/2017 - small type 4  Height:   Ht Readings from Last 1 Encounters:  09/28/17 5' 2" (1.575 m)    Weight:   Wt Readings from Last 1 Encounters:  09/28/17 122 lb 14.4 oz (55.7 kg)    Ideal Body Weight:  50 kg  BMI:  Body mass index is 22.48 kg/m.  Estimated Nutritional Needs:   Kcal:  1450-1690 (30-35 kcal/kg)  Protein:  72-87 grams (1.5-1.8 grams/kg)  Fluid:    1.5-1.7 L/day (1 mL/kcal)  Willey Blade, MS, RD, LDN Office: [phone] Pager: [phone] After Hours/Weekend Pager: [phone]

## 2017-09-29 NOTE — Progress Notes (Signed)
[facility] Hematology/Oncology Progress Note  Date of admission: 09/27/2017  Hospital day:  09/29/2017  Chief Complaint: Suzanne Jordan is a 72 y.o. female with a distant history of breast cancer and recently documented liver lesions who was admitted through the emergency room with progressive weakness and failure to thrive.  Subjective:  Feels a little better today after paracentesis.    Social History: The patient is alone today.  Allergies: No Known Allergies  Scheduled Medications: . enoxaparin (LOVENOX) injection  40 mg Subcutaneous Q24H  . feeding supplement (ENSURE ENLIVE)  237 mL Oral TID BM  . megestrol  400 mg Oral Daily  . metoprolol tartrate  12.5 mg Oral BID  . midodrine  2.5 mg Oral BID WC    Review of Systems: GENERAL:  Fatigue.  No fevers, sweats or weight loss. PERFORMANCE STATUS (ECOG):  3 HEENT:  No visual changes, runny nose, sore throat, mouth sores or tenderness. Lungs: No shortness of breath or cough.  No hemoptysis. Cardiac:  No chest pain, palpitations, orthopnea, or PND. GI:  Abdomen feels better s/p paracentesis.  No nausea, vomiting, diarrhea, constipation, melena or hematochezia.  Bowel movement today. GU:  No urgency, frequency, dysuria, or hematuria. Musculoskeletal:  Muscle spasticity due to cerebral palsy.  No back pain.  No joint pain.  No muscle tenderness. Extremities:  Lower extremity swelling. Skin:  No rashes or skin changes. Neuro:  Generalized weakness.  No use of right arm secondary to cerebral palsy.  No headache. Endocrine:  No diabetes, thyroid issues, hot flashes or night sweats. Psych:  No mood changes, depression or anxiety. Pain:  No focal pain. Review of systems:  All other systems reviewed and found to be negative.  Physical Exam: Blood pressure 102/61, pulse 97, temperature 97.6 F (36.4 C), temperature source Oral, resp. rate 18, height _0  (1.575 m), weight 122 lb 14.4 oz (55.7 kg), SpO2 98 %.   GENERAL:  Thin woman sitting comfortably on the medical unit in no acute distress. MENTAL STATUS:  Alert and oriented to person, place and time. HEAD:  Short gray hair.  Normocephalic, atraumatic, face symmetric, no Cushingoid features. EYES:  Blue eyes.  Pupils equal round and reactive to light and accomodation.  No conjunctivitis or scleral icterus. ENT:  Oropharynx clear without lesion.  Tongue normal. Mucous membranes moist.  RESPIRATORY:  Decreased breath sounds at the bases.  Clear to auscultation without rales, wheezes or rhonchi. CARDIOVASCULAR:  Regular rate and rhythm without murmur, rub or gallop. ABDOMEN:  Soft, non-tender, with active bowel sounds, and no appreciable hepatosplenomegaly.  No masses. SKIN:  Baclofen pump in right lower abdomen.  No rashes, ulcers or lesions. EXTREMITIES: Right arm flexed and held by left hand. Bilateral 1-2+ lower extremity edema.  No skin discoloration or tenderness.  No palpable cords. NEUROLOGICAL: Cerebral palsy. PSYCH:  Appropriate.   Results for orders placed or performed during the hospital encounter of 09/27/17 (from the past 48 hour(s))  Lactic acid, plasma     Status: Abnormal   Collection Time: 09/27/17 10:05 PM  Result Value Ref Range   Lactic Acid, Venous 2.1 (HH) 0.5 - 1.9 mmol/L    Comment: CRITICAL RESULT CALLED TO, READ BACK BY AND VERIFIED WITH ANN CALES AT 2246 09/27/17.PMH Performed at [facility], [address] [place],  [address]   Basic metabolic panel     Status: Abnormal   Collection Time: 09/28/17  4:03 AM  Result Value Ref Range   Sodium  136 135 - 145 mmol/L   Potassium 4.4 3.5 - 5.1 mmol/L   Chloride 109 101 - 111 mmol/L   CO2 17 (L) 22 - 32 mmol/L   Glucose, Bld 113 (H) 65 - 99 mg/dL   BUN 23 (H) 6 - 20 mg/dL   Creatinine, Ser 1.13 (H) 0.44 - 1.00 mg/dL   Calcium 8.0 (L) 8.9 - 10.3 mg/dL   GFR calc non Af Amer 47 (L) >60 mL/min   GFR calc Af Amer 55 (L) >60 mL/min    Comment: (NOTE) The  eGFR has been calculated using the CKD EPI equation. This calculation has not been validated in all clinical situations. eGFR's persistently <60 mL/min signify possible Chronic Kidney Disease.    Anion gap 10 5 - 15    Comment: Performed at [facility], [address] [place], [place] [address]  CBC     Status: Abnormal   Collection Time: 09/28/17  5:42 AM  Result Value Ref Range   WBC 9.7 3.6 - 11.0 K/uL   RBC 3.92 3.80 - 5.20 MIL/uL   Hemoglobin 12.4 12.0 - 16.0 g/dL   HCT 37.1 35.0 - 47.0 %   MCV 94.7 80.0 - 100.0 fL   MCH 31.7 26.0 - 34.0 pg   MCHC 33.5 32.0 - 36.0 g/dL   RDW 16.9 (H) 11.5 - 14.5 %   Platelets 309 150 - 440 K/uL    Comment: Performed at [facility], [address] [place], [place] [address]  Hepatic function panel     Status: Abnormal   Collection Time: 09/29/17 10:22 AM  Result Value Ref Range   Total Protein 6.2 (L) 6.5 - 8.1 g/dL   Albumin 2.4 (L) 3.5 - 5.0 g/dL   AST 103 (H) 15 - 41 U/L   ALT 56 (H) 14 - 54 U/L   Alkaline Phosphatase 497 (H) 38 - 126 U/L   Total Bilirubin 2.5 (H) 0.3 - 1.2 mg/dL   Bilirubin, Direct 1.3 (H) 0.1 - 0.5 mg/dL   Indirect Bilirubin 1.2 (H) 0.3 - 0.9 mg/dL    Comment: Performed at [facility], [place], [place] [address]  Lactate dehydrogenase (pleural or peritoneal fluid)     Status: Abnormal   Collection Time: 09/29/17 11:20 AM  Result Value Ref Range   LD, Fluid 48 (H) 3 - 23 U/L    Comment: (NOTE) Results should be evaluated in conjunction with serum values    Fluid Type-FLDH PERITONEAL     Comment: Performed at [facility], [address] [place], [place] [address] CORRECTED ON 06/04 AT 1133: PREVIOUSLY REPORTED AS CYTOPERI   Body fluid cell count with differential     Status: None   Collection Time: 09/29/17 11:20 AM  Result Value Ref Range   Fluid Type-FCT PERITONEAL     Comment: CORRECTED ON 06/04 AT 1133: PREVIOUSLY REPORTED AS CYTOPERI   Color,  Fluid YELLOW YELLOW   Appearance, Fluid CLEAR CLEAR   WBC, Fluid 81 cu mm   Neutrophil Count, Fluid 33 %   Lymphs, Fluid 48 %   Monocyte-Macrophage-Serous Fluid 19 %   Eos, Fluid 0 %    Comment: Performed at [facility], [address] [place], [place] [address]  Body fluid culture     Status: None (Preliminary result)   Collection Time: 09/29/17 11:20 AM  Result Value Ref Range   Specimen Description      PERITONEAL Performed at [facility],  [place], [place] [address]    Special Requests      PERITONEAL Performed at [facility], [place], [place] [address]    Gram Stain      WBC PRESENT,BOTH PMN AND MONONUCLEAR NO ORGANISMS SEEN CYTOSPIN SMEAR Performed at [facility], [place] [address] [place], [place] [address]    Culture PENDING    Report Status PENDING   Amylase, pleural or peritoneal fluid     Status: None   Collection Time: 09/29/17 11:20 AM  Result Value Ref Range   Amylase, Fluid 55 U/L    Comment: NO NORMAL RANGE ESTABLISHED FOR THIS TEST   Fluid Type-FAMY PERITONEAL     Comment: Performed at [facility], [address] [place], [place] [address] CORRECTED ON 06/04 AT 8242: PREVIOUSLY REPORTED AS CYTOPERI   Albumin, pleural or peritoneal fluid     Status: None   Collection Time: 09/29/17 11:20 AM  Result Value Ref Range   Albumin, Fluid <1.0 g/dL    Comment: (NOTE) No normal range established for this test Results should be evaluated in conjunction with serum values    Fluid Type-FALB PERITONEAL     Comment: Performed at [facility], [address] [place], [place] [address] CORRECTED ON 06/04 AT 1133: PREVIOUSLY REPORTED AS CYTOPERI   Protein, pleural or peritoneal fluid     Status: None   Collection Time: 09/29/17 11:20 AM  Result Value Ref Range   Total protein, fluid <3.0 g/dL    Comment: (NOTE) No normal range established for this test Results should  be evaluated in conjunction with serum values    Fluid Type-FTP PERITONEAL     Comment: Performed at [facility], [address] [place], [place] [address] CORRECTED ON 06/04 AT 1133: PREVIOUSLY REPORTED AS CYTOPERI   Glucose, pleural or peritoneal fluid     Status: None   Collection Time: 09/29/17 11:20 AM  Result Value Ref Range   Glucose, Fluid 116 mg/dL    Comment: (NOTE) No normal range established for this test Results should be evaluated in conjunction with serum values    Fluid Type-FGLU PERITONEAL     Comment: Performed at [facility], [address] [place],  [address] CORRECTED ON 06/04 AT 1133: PREVIOUSLY REPORTED AS CYTOPERI    Ct Abdomen Pelvis W Contrast  Result Date: 09/28/2017 CLINICAL DATA:  Abdominal distension. Palsy. Indeterminate lesions within liver identified on MRI 07/09/2017. EXAM: CT ABDOMEN AND PELVIS WITH CONTRAST TECHNIQUE: Multidetector CT imaging of the abdomen and pelvis was performed using the standard protocol following bolus administration of intravenous contrast. CONTRAST:  29m OMNIPAQUE IOHEXOL 300 MG/ML  SOLN COMPARISON:  MRI 07/09/2017 FINDINGS: Lower chest: Moderate bilateral pleural effusions and passive atelectasis. Hepatobiliary: Multiple small hypodense lesions within the liver not changed from comparison MRI abdomen for technique. The liver has a shrunken nodular appears consistent cirrhosis. No biliary duct dilatation. Gallbladder normal. Pancreas: Pancreas is normal. No ductal dilatation. No pancreatic inflammation. Spleen: Normal spleen Adrenals/urinary tract: Adrenal glands and kidneys are normal. The ureters and bladder normal. Stomach/Bowel: Stomach, small bowel, appendix, and cecum are normal. The colon and rectosigmoid colon are normal. Vascular/Lymphatic: Stomach and duodenum normal. The oral contrast progresses slowly through the small bowel. The small bowel is mildly dilated to 3 cm. There is extensive  intraperitoneal free fluid within the abdomen and pelvis which likely contributes to mild small bowel wall edema. There is poor progression of  the oral contrast. The ascending colon is collapsed. The transverse and descending colon are collapsed. No obstructing lesions identified however there is extensive free fluid which does limit evaluation of the bowel. Reproductive: Post hysterectomy Other: Extensive free fluid. Streak artifact from the generator pack in the RIGHT anterior abdominal wall. Musculoskeletal: No aggressive osseous lesion. IMPRESSION: 1. New large volume intraperitoneal free fluid suggest ascites of cirrhosis. 2. Dilation of the small bowel and poor progression oral contrast. Differential includes ileus versus bowel obstruction. No clear bowel obstructing lesion identified within the small bowel or colon. 3. Numerous small hypodense liver lesions are similar to comparison MRI on 07/09/2017 at which time metastatic lesions were suspected. Differential includes metastatic disease versus hepatocellular carcinoma versus granulomatous disease. Recommend liver tissue sampling after paracentesis. Electronically Signed   By: Suzy Bouchard M.D.   On: 09/28/2017 18:31   US Paracentesis  Result Date: 09/29/2017 INDICATION: Ascites EXAM: ULTRASOUND GUIDED PARACENTESIS MEDICATIONS: None. COMPLICATIONS: None immediate. PROCEDURE: Informed written consent was obtained from the patient after a discussion of the risks, benefits and alternatives to treatment. A timeout was performed prior to the initiation of the procedure. Initial ultrasound scanning demonstrates a large amount of ascites within the right lower abdominal quadrant. The right lower abdomen was prepped and draped in the usual sterile fashion. 1% lidocaine with epinephrine was used for local anesthesia. Following this, a 6 Fr Safe-T-Centesis catheter was introduced. An ultrasound image was saved for documentation purposes. The paracentesis was  performed. The catheter was removed and a dressing was applied. The patient tolerated the procedure well without immediate post procedural complication. FINDINGS: A total of approximately 5.45 L of clear yellow fluid was removed. Samples were sent to the laboratory as requested by the clinical team. IMPRESSION: Successful ultrasound-guided paracentesis yielding 5.45 liters of peritoneal fluid. Electronically Signed   By: Inez Catalina M.D.   On: 09/29/2017 13:33    Assessment:  Suzanne Jordan is a 72 y.o. female with a history of stage I right breast cancer (2010) presenting with abnormal liver function tests and liver lesions in 06/2017.  Tumor markers:  AFP(3.8),CA15-3 (178.6),CA27.29(226.1) MLYYTK(3546).  She has never had a colonoscopy.  Working diagnosis has been a primary of GI origin.  She was previously scheduled for liver biopsy, but she cancelled.  Abdomen and pelvis CT on 09/28/2017 revealed new large volume ascites, dilatation of the small bowel (ileus versus obstruction), and numerous small hypodense liver lesions.  She is s/p 5.45 liter paracentesis today.  Cytology is pending.  She has cerebral palsy.  Symptomatically, she remains fatigued, but is more comfortable today s/p paracentesis.  Performance status remains poor.   Plan:   1.  Oncology:  Patient s/p paracentesis today.  Anticipate cytology results tomorrow or Thursday.  Unclear if diagnosis will be able to be made from ascitic fluid.  Patient states that she is taking things "day by day".  She clearly states that she wants future treatment.  If cytology does not yield result, she is adamant that she does not want to undergo liver biopsy while she is a current inpatient.    She is tentatively set up for liver biopsy (if needed) on 10/06/2017.    2.  Disposition:  Patient states that she is being discharged on 0606/2019 to [facility].   Lequita Asal, MD  09/29/2017, 9:49 PM

## 2017-09-30 ENCOUNTER — Ambulatory Visit: Payer: Medicare Other | Admitting: Family Medicine

## 2017-09-30 ENCOUNTER — Inpatient Hospital Stay: Payer: Medicare Other | Admitting: Family Medicine

## 2017-09-30 DIAGNOSIS — Z7189 Other specified counseling: Secondary | ICD-10-CM

## 2017-09-30 DIAGNOSIS — E43 Unspecified severe protein-calorie malnutrition: Secondary | ICD-10-CM

## 2017-09-30 DIAGNOSIS — G809 Cerebral palsy, unspecified: Secondary | ICD-10-CM

## 2017-09-30 DIAGNOSIS — R188 Other ascites: Secondary | ICD-10-CM

## 2017-09-30 DIAGNOSIS — Z515 Encounter for palliative care: Secondary | ICD-10-CM

## 2017-09-30 DIAGNOSIS — K769 Liver disease, unspecified: Secondary | ICD-10-CM

## 2017-09-30 LAB — TRIGLYCERIDES, BODY FLUIDS: TRIGLYCERIDES FL: 48 mg/dL

## 2017-09-30 LAB — PROTEIN, BODY FLUID (OTHER): Total Protein, Body Fluid Other: 1.1 g/dL

## 2017-09-30 LAB — CYTOLOGY - NON PAP

## 2017-09-30 MED ORDER — METOPROLOL TARTRATE 25 MG PO TABS
25.0000 mg | ORAL_TABLET | Freq: Two times a day (BID) | ORAL | Status: DC
  Administered 2017-09-30: 25 mg via ORAL
  Filled 2017-09-30 (×2): qty 1

## 2017-09-30 NOTE — Plan of Care (Signed)
  Problem: Clinical Measurements: Goal: Diagnostic test results will improve Outcome: Progressing   Problem: Respiratory: Goal: Ability to maintain adequate ventilation will improve Outcome: Progressing   Problem: Education: Goal: Knowledge of General Education information will improve Outcome: Progressing   Problem: Nutrition: Goal: Adequate nutrition will be maintained Outcome: Progressing   Problem: Safety: Goal: Ability to remain free from injury will improve Outcome: Progressing   Problem: Skin Integrity: Goal: Risk for impaired skin integrity will decrease Outcome: Progressing

## 2017-09-30 NOTE — ACP (Advance Care Planning) (Signed)
Advanced directives, concepts specific to code status, artifical feeding and hydration, and rehospitalization were considered and discussed. Ashby has advanced directives completed and have been placed on her chart. She has previously elected DNR status and has an [place] in the event that she cannot make decisions for herself.   She desires DNR status meaning if she is found to have no heartbeat and not breathing she would not want resuscitation. Otherwise she desires full scope medical treatment.  Mariana Kaufman, AGNP-C Palliative Medicine  Please call Palliative Medicine team phone with any questions [phone]. For individual providers please see AMION.

## 2017-09-30 NOTE — Progress Notes (Addendum)
Discussed with palliative care team and the patient.  Patient has confirmed DNR status

## 2017-09-30 NOTE — Progress Notes (Signed)
[facility] [place] NAME: Suzanne Jordan    MR#:  [id number]  DATE OF BIRTH:  15-Mar-1946  SUBJECTIVE:  CHIEF COMPLAINT:   Chief Complaint  Patient presents with  . Weakness  . Nausea   -Post paracentesis yesterday and almost 6 L fluid taken out. -Feeling well today  REVIEW OF SYSTEMS:  Review of Systems  Constitutional: Positive for malaise/fatigue. Negative for chills and fever.  HENT: Negative for congestion, ear discharge, hearing loss and nosebleeds.   Eyes: Negative for blurred vision and double vision.  Respiratory: Negative for cough, shortness of breath and wheezing.   Cardiovascular: Positive for leg swelling. Negative for chest pain and palpitations.  Gastrointestinal: Positive for abdominal pain. Negative for constipation, diarrhea, nausea and vomiting.  Genitourinary: Negative for dysuria.  Musculoskeletal: Negative for myalgias.  Neurological: Negative for dizziness, focal weakness, seizures, weakness and headaches.  Psychiatric/Behavioral: Negative for depression.    DRUG ALLERGIES:  No Known Allergies  VITALS:  Blood pressure 103/68, pulse (!) 114, temperature 98.6 F (37 C), resp. rate 16, height 5\' 2"  (1.575 m), weight 55.7 kg (122 lb 14.4 oz), SpO2 96 %.  PHYSICAL EXAMINATION:  Physical Exam  GENERAL:  72 y.o.-year-old ill nourished patient lying in the bed with no acute distress.  EYES: Pupils equal, round, reactive to light and accommodation. No scleral icterus. Extraocular muscles intact.  HEENT: Head atraumatic, normocephalic. Oropharynx and nasopharynx clear.  NECK:  Supple, no jugular venous distention. No thyroid enlargement, no tenderness.  LUNGS: Normal breath sounds bilaterally, no wheezing, rales,rhonchi or crepitation. No use of accessory muscles of respiration. Decreased bibasilar breath sounds CARDIOVASCULAR: S1, S2 normal. No rubs, or gallops. 2/6 systolic murmur present ABDOMEN: Soft,  nontender,  Still distended. Bowel sounds present. No organomegaly or mass.  Baclofen pump in lower abdomen EXTREMITIES: No pedal edema, cyanosis, or clubbing.  NEUROLOGIC: Cranial nerves II through XII are intact. Spastic in all extremities especially right sided. Sensation intact. Gait not checked. Global weakness PSYCHIATRIC: The patient is alert and oriented x 3.  SKIN: No obvious rash, lesion, or ulcer.    LABORATORY PANEL:   CBC Recent Labs  Lab 09/28/17 0542  WBC 9.7  HGB 12.4  HCT 37.1  PLT 309   ------------------------------------------------------------------------------------------------------------------  Chemistries  Recent Labs  Lab 09/28/17 0403 09/29/17 1022  NA 136  --   K 4.4  --   CL 109  --   CO2 17*  --   GLUCOSE 113*  --   BUN 23*  --   CREATININE 1.13*  --   CALCIUM 8.0*  --   AST  --  103*  ALT  --  56*  ALKPHOS  --  497*  BILITOT  --  2.5*   ------------------------------------------------------------------------------------------------------------------  Cardiac Enzymes Recent Labs  Lab 09/27/17 1927  TROPONINI 0.03*   ------------------------------------------------------------------------------------------------------------------  RADIOLOGY:  Ct Abdomen Pelvis W Contrast  Result Date: 09/28/2017 CLINICAL DATA:  Abdominal distension. Palsy. Indeterminate lesions within liver identified on MRI 07/09/2017. EXAM: CT ABDOMEN AND PELVIS WITH CONTRAST TECHNIQUE: Multidetector CT imaging of the abdomen and pelvis was performed using the standard protocol following bolus administration of intravenous contrast. CONTRAST:  58mL OMNIPAQUE IOHEXOL 300 MG/ML  SOLN COMPARISON:  MRI 07/09/2017 FINDINGS: Lower chest: Moderate bilateral pleural effusions and passive atelectasis. Hepatobiliary: Multiple small hypodense lesions within the liver not changed from comparison MRI abdomen for technique. The liver has a shrunken nodular appears consistent  cirrhosis. No biliary duct  dilatation. Gallbladder normal. Pancreas: Pancreas is normal. No ductal dilatation. No pancreatic inflammation. Spleen: Normal spleen Adrenals/urinary tract: Adrenal glands and kidneys are normal. The ureters and bladder normal. Stomach/Bowel: Stomach, small bowel, appendix, and cecum are normal. The colon and rectosigmoid colon are normal. Vascular/Lymphatic: Stomach and duodenum normal. The oral contrast progresses slowly through the small bowel. The small bowel is mildly dilated to 3 cm. There is extensive intraperitoneal free fluid within the abdomen and pelvis which likely contributes to mild small bowel wall edema. There is poor progression of the oral contrast. The ascending colon is collapsed. The transverse and descending colon are collapsed. No obstructing lesions identified however there is extensive free fluid which does limit evaluation of the bowel. Reproductive: Post hysterectomy Other: Extensive free fluid. Streak artifact from the generator pack in the RIGHT anterior abdominal wall. Musculoskeletal: No aggressive osseous lesion. IMPRESSION: 1. New large volume intraperitoneal free fluid suggest ascites of cirrhosis. 2. Dilation of the small bowel and poor progression oral contrast. Differential includes ileus versus bowel obstruction. No clear bowel obstructing lesion identified within the small bowel or colon. 3. Numerous small hypodense liver lesions are similar to comparison MRI on 07/09/2017 at which time metastatic lesions were suspected. Differential includes metastatic disease versus hepatocellular carcinoma versus granulomatous disease. Recommend liver tissue sampling after paracentesis. Electronically Signed   By: Suzy Bouchard M.D.   On: 09/28/2017 18:31   US Paracentesis  Result Date: 09/29/2017 INDICATION: Ascites EXAM: ULTRASOUND GUIDED PARACENTESIS MEDICATIONS: None. COMPLICATIONS: None immediate. PROCEDURE: Informed written consent was obtained from  the patient after a discussion of the risks, benefits and alternatives to treatment. A timeout was performed prior to the initiation of the procedure. Initial ultrasound scanning demonstrates a large amount of ascites within the right lower abdominal quadrant. The right lower abdomen was prepped and draped in the usual sterile fashion. 1% lidocaine with epinephrine was used for local anesthesia. Following this, a 6 Fr Safe-T-Centesis catheter was introduced. An ultrasound image was saved for documentation purposes. The paracentesis was performed. The catheter was removed and a dressing was applied. The patient tolerated the procedure well without immediate post procedural complication. FINDINGS: A total of approximately 5.45 L of clear yellow fluid was removed. Samples were sent to the laboratory as requested by the clinical team. IMPRESSION: Successful ultrasound-guided paracentesis yielding 5.45 liters of peritoneal fluid. Electronically Signed   By: Inez Catalina M.D.   On: 09/29/2017 13:33    EKG:   Orders placed or performed during the hospital encounter of 09/27/17  . EKG 12-Lead  . EKG 12-Lead    ASSESSMENT AND PLAN:   72 year old female with past medical history significant for history of breast cancer, hypertension, cerebral palsy brought from home due to weakness, malaise  1. Weakness, failure to thrive- ? Concern for worsening underlying malignancy -Recent liver ultrasound showing some liver masses.    -CT abd done as inpatient showing significant ascites and multiple liver lesions - oncology consulted- for liver biopsy next week scheduled - paracentesis done- cytology pending - palliative care consulted -Physical therapy consult- recommended rehab - megace added for appetite  2.  Tachycardia-sinus tachycardia.  On metoprolol- dose increased today  3.  Chronic hypotension-continue low-dose Midodrine  4.  DVT prophylaxis-on Lovenox   Living will paperwork completed.  Patient  opted her cousin blended to be her healthcare power of attorney. -During my discussion this morning, patient wanted to be a DNR. -However palliative care notes do not reflect that.  Will confirm.  Patient will need rehab at discharge    All the records are reviewed and case discussed with Care Management/Social Workerr. Management plans discussed with the patient, family and they are in agreement.  CODE STATUS: Full Code  TOTAL TIME TAKING CARE OF THIS PATIENT: 36 minutes.   POSSIBLE D/C TOMORROW, DEPENDING ON CLINICAL CONDITION.   Paytyn Mesta M.D on 09/30/2017 at 3:11 PM  Between 7am to 6pm - Pager - [phone]  After 6pm go to [email or web address] - password [email or web address] [facility]  Office  [phone]  CC: Primary care physician; Guadalupe Maple, MD

## 2017-09-30 NOTE — Consult Note (Signed)
   [facility] CM Inpatient Consult   09/30/2017  Suzanne Jordan 03/23/1946 [id number]  Patient screened for potential [place] Management services. Patient is on the [facility] registry as a benefit of their Next Gen medicare . Electronic medical record reveals patient's discharge plan is SNF with palliative care. [facility] Care Management services not appropriate at this time. If patient's post hospital needs change please place a [facility] Care Management consult. For questions please contact:   Hollye Pritt RN, [facility] Liaison  [phone]) Business Mobile [phone]) Toll free office

## 2017-09-30 NOTE — Consult Note (Addendum)
Consultation Note Date: 09/30/2017   Patient Name: Suzanne Jordan  DOB: 03-02-1946  MRN: [id number]  Age / Sex: 72 y.o., female  PCP: Guadalupe [place], MD Referring Physician: Gladstone Lighter, MD  Reason for Consultation: Establishing goals of care  HPI/Patient Profile: 72 y.o. female  with past medical history of HTN, cerebal palsy, breast cancer, liver lesions previously seen on MRI, admitted on 09/27/2017 with weakness. Workup reveals abdominal ascites, CT scan shows possible illeum vs bowel obstruction- patient without n/v, +BM. She had paracentesis (cytology sent- not yet resulted) performed with great relief. Palliative medicine consulted for [place].    Clinical Assessment and Goals of Care:  I have reviewed medical records including EPIC notes, labs and imaging, received report, assessed the patient and then met at the bedside along with patient to discuss diagnosis prognosis, GOC, EOL wishes, disposition and options.  I introduced Palliative Medicine as specialized medical care for people living with serious illness. It focuses on providing relief from the symptoms and stress of a serious illness. The goal is to improve quality of life for both the patient and the family.  We discussed a brief life review of the patient. She was living at home prior to admission. She lives with her son who "comes and goes". She also has an aid who comes and helps her.   As far as functional and nutritional status- she is small but she doesn't feel she has lost any weight recently. She notes her weight has been stable and her appetite has been well.     We discussed their current illness and what it means in the larger context of their on-going co-morbidities.  Natural disease trajectory and expectations at EOL were discussed. Suzanne Jordan says she loves watching tv. She has been bedbound, but she is still able to get out of her  house and go to church. She enjoys interacting with people. She has known about the lesions on her liver. She is interested in pursuing biopsy, but she is not certain is she would pursue chemotherapy if it were cancer.  Advanced directives, concepts specific to code status, artifical feeding and hydration, and rehospitalization were considered and discussed. Suzanne Jordan has advanced directives completed and have been placed on her chart. She has previously elected DNR status and has an [place] in the event that she cannot make decisions for herself.   Hospice and Palliative Care services outpatient were explained and offered.  Questions and concerns were addressed.  Hard Choices booklet left for review. The family was encouraged to call with questions or concerns.   Primary Decision Maker PATIENT    SUMMARY OF RECOMMENDATIONS -D/C to  [facility]- she has a family member there that she is eager to spend time with  -Sadie would like to pursue biopsy, but is uncertain of what path of treatment she would choose- she would want to discuss that after she learned of her true diagnosis- she does understand the gravity of weighing the value of quality of life vs the  longevity that chemotherapy might bring her in her functional state -Recommend continued follow-up with outpatient Palliative care at SNF and beyond for assistance with treatment decision making and [place]- patient is agreeable to this  Addendum: I was able to visit patient and her [place] later in the day- privately Holley Raring tells me that patient's son is in jail. Patient was living at home alone. Holley Raring states that patient's health and functional status has dramatically declined over the last several months. Patient has lost significant amount of weight since December. [name] found patient on the couch, in a depends, unable to care for herself, not eating or drinking.  Holley Raring is very concerned for patient's health status.  I again  discussed options of Hospice with patient and HCPOA. Patient continues to desire biopsy and discussion of treatment options with Oncology. She does, however, state she is uncertain if she would pursue treatment if the outcome of the biopsy were to be malignancy. [name] supports patient's stance.   Code Status/Advance Care Planning:   DNR  Prognosis:    Unable to determine  Discharge Planning: [place] for rehab with Palliative care service follow-up  Primary Diagnoses: Present on Admission: . SIRS (systemic inflammatory response syndrome) (HCC) . Essential hypertension . Cerebral palsy ([place])   I have reviewed the medical record, interviewed the patient and family, and examined the patient. The following aspects are pertinent.  Past Medical History:  Diagnosis Date  . Breast cancer ([place]) 2010   lumpectomy  . Cancer ([place]) 2010   T1c, N0, ER/ PR +. 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   . Cerebral palsy ([place])    at birth   Social History   Socioeconomic History  . Marital status: Widowed    Spouse name: Not on file  . Number of children: 1  . Years of education: 19  . Highest education level: 12th grade  Occupational History  . Not on file  Social Needs  . Financial resource strain: Not very hard  . Food insecurity:    Worry: Sometimes true    Inability: Never true  . Transportation needs:    Medical: No    Non-medical: No  Tobacco Use  . Smoking status: Never Smoker  . Smokeless tobacco: Never Used  Substance and Sexual Activity  . Alcohol use: No  . Drug use: No  . Sexual activity: Not Currently  Lifestyle  . Physical activity:    Days per week: 0 days    Minutes per session: 0 min  . Stress: Not at all  Relationships  . Social connections:    Talks on phone: Never    Gets together: Twice a week    Attends religious service: More than 4 times per year    Active member of club or organization: No    Attends meetings of clubs or organizations:  Never    Relationship status: Widowed  Other Topics Concern  . Not on file  Social History Narrative  . Not on file   Family History  Problem Relation Age of Onset  . Stroke Father        passed Jan 2014 age 74  . Cancer Other        colon,breast,ovarian cancers, relationships not listed  . Breast cancer Neg Hx    Scheduled Meds: . enoxaparin (LOVENOX) injection  40 mg Subcutaneous Q24H  . feeding supplement (ENSURE ENLIVE)  237 mL Oral TID BM  . megestrol  400 mg Oral Daily  . metoprolol  tartrate  25 mg Oral BID  . midodrine  2.5 mg Oral BID WC   Continuous Infusions: PRN Meds:.acetaminophen **OR** acetaminophen, diazepam, ondansetron **OR** ondansetron (ZOFRAN) IV Medications Prior to Admission:  Prior to Admission medications   Medication Sig Start Date End Date Taking? Authorizing Provider  diazepam (VALIUM) 10 MG tablet Take 1 tablet (10 mg total) by mouth every 12 (twelve) hours as needed for anxiety. 10/26/14  Yes Crissman, Jeannette How, MD  fluticasone (FLONASE) 50 MCG/ACT nasal spray Place 2 sprays into both nostrils daily. 04/01/17  Yes Crissman, Jeannette How, MD  midodrine (PROAMATINE) 2.5 MG tablet Take 1 tablet (2.5 mg total) by mouth 2 (two) times daily with a meal. Hold if BP >841 systolic 6/60/63  Yes Fritzi Mandes, MD  Multiple Vitamin (MULTIVITAMIN) capsule Take 1 capsule by mouth daily.   Yes [provider]   No Known Allergies Review of Systems  Constitutional: Positive for activity change and fatigue. Negative for unexpected weight change.  Gastrointestinal: Negative for abdominal distention, constipation, nausea and vomiting.  Psychiatric/Behavioral: Negative for dysphoric mood and sleep disturbance.    Physical Exam  Constitutional: She is oriented to person, place, and time.  cachetic  Pulmonary/Chest: Effort normal and breath sounds normal.  Neurological: She is alert and oriented to person, place, and time.  Skin: Skin is warm and dry. There is pallor.    Psychiatric: She has a normal mood and affect. Her behavior is normal. Judgment and thought content normal.  Nursing note and vitals reviewed.   Vital Signs: BP 103/68 (BP Location: Left Arm)   Pulse (!) 114   Temp 98.6 F (37 C)   Resp 16   Ht 5' 2" (1.575 m)   Wt 55.7 kg (122 lb 14.4 oz)   SpO2 96%   BMI 22.48 kg/m  Pain Scale: 0-10 POSS *See Group Information*: 1-Acceptable,Awake and alert Pain Score: 0-No pain   SpO2: SpO2: 96 % O2 Device:SpO2: 96 % O2 Flow Rate: .   IO: Intake/output summary: No intake or output data in the 24 hours ending 09/30/17 1312  LBM: Last BM Date: 09/29/17 Baseline Weight: Weight: 48.1 kg (106 lb) Most recent weight: Weight: 55.7 kg (122 lb 14.4 oz)     Palliative Assessment/Data: PPS: 20%     Thank you for this consult. Palliative medicine will continue to follow and assist as needed.   Time In: 1330, 1530 Time Out: 1445, 1615 Time Total: 120 mins Prolonged services billed: yes  Greater than 50%  of this time was spent counseling and coordinating care related to the above assessment and plan.  Signed by: Mariana Kaufman, AGNP-C Palliative Medicine    Please contact Palliative Medicine Team phone at [phone] for questions and concerns.  For individual provider: See Shea Evans

## 2017-10-01 DIAGNOSIS — K7689 Other specified diseases of liver: Secondary | ICD-10-CM | POA: Diagnosis not present

## 2017-10-01 DIAGNOSIS — Z9223 Personal history of estrogen therapy: Secondary | ICD-10-CM | POA: Diagnosis not present

## 2017-10-01 DIAGNOSIS — R140 Abdominal distension (gaseous): Secondary | ICD-10-CM | POA: Diagnosis not present

## 2017-10-01 DIAGNOSIS — R180 Malignant ascites: Secondary | ICD-10-CM | POA: Diagnosis not present

## 2017-10-01 DIAGNOSIS — R627 Adult failure to thrive: Secondary | ICD-10-CM | POA: Diagnosis not present

## 2017-10-01 DIAGNOSIS — D490 Neoplasm of unspecified behavior of digestive system: Secondary | ICD-10-CM | POA: Diagnosis not present

## 2017-10-01 DIAGNOSIS — R5381 Other malaise: Secondary | ICD-10-CM | POA: Diagnosis not present

## 2017-10-01 DIAGNOSIS — R4182 Altered mental status, unspecified: Secondary | ICD-10-CM | POA: Diagnosis not present

## 2017-10-01 DIAGNOSIS — Z170 Estrogen receptor positive status [ER+]: Secondary | ICD-10-CM | POA: Diagnosis not present

## 2017-10-01 DIAGNOSIS — R188 Other ascites: Secondary | ICD-10-CM | POA: Diagnosis not present

## 2017-10-01 DIAGNOSIS — C787 Secondary malignant neoplasm of liver and intrahepatic bile duct: Secondary | ICD-10-CM | POA: Diagnosis not present

## 2017-10-01 DIAGNOSIS — I959 Hypotension, unspecified: Secondary | ICD-10-CM | POA: Diagnosis not present

## 2017-10-01 DIAGNOSIS — Z853 Personal history of malignant neoplasm of breast: Secondary | ICD-10-CM | POA: Diagnosis not present

## 2017-10-01 DIAGNOSIS — F411 Generalized anxiety disorder: Secondary | ICD-10-CM | POA: Diagnosis not present

## 2017-10-01 DIAGNOSIS — Z79899 Other long term (current) drug therapy: Secondary | ICD-10-CM | POA: Diagnosis not present

## 2017-10-01 DIAGNOSIS — Z7401 Bed confinement status: Secondary | ICD-10-CM | POA: Diagnosis not present

## 2017-10-01 DIAGNOSIS — C220 Liver cell carcinoma: Secondary | ICD-10-CM | POA: Diagnosis not present

## 2017-10-01 DIAGNOSIS — C50911 Malignant neoplasm of unspecified site of right female breast: Secondary | ICD-10-CM | POA: Diagnosis not present

## 2017-10-01 DIAGNOSIS — I951 Orthostatic hypotension: Secondary | ICD-10-CM | POA: Diagnosis not present

## 2017-10-01 DIAGNOSIS — G808 Other cerebral palsy: Secondary | ICD-10-CM | POA: Diagnosis not present

## 2017-10-01 DIAGNOSIS — Z923 Personal history of irradiation: Secondary | ICD-10-CM | POA: Diagnosis not present

## 2017-10-01 DIAGNOSIS — R531 Weakness: Secondary | ICD-10-CM | POA: Diagnosis not present

## 2017-10-01 DIAGNOSIS — R5383 Other fatigue: Secondary | ICD-10-CM | POA: Diagnosis not present

## 2017-10-01 DIAGNOSIS — I9589 Other hypotension: Secondary | ICD-10-CM | POA: Diagnosis not present

## 2017-10-01 DIAGNOSIS — E440 Moderate protein-calorie malnutrition: Secondary | ICD-10-CM | POA: Diagnosis not present

## 2017-10-01 DIAGNOSIS — F419 Anxiety disorder, unspecified: Secondary | ICD-10-CM | POA: Diagnosis not present

## 2017-10-01 DIAGNOSIS — C229 Malignant neoplasm of liver, not specified as primary or secondary: Secondary | ICD-10-CM | POA: Diagnosis not present

## 2017-10-01 DIAGNOSIS — G809 Cerebral palsy, unspecified: Secondary | ICD-10-CM | POA: Diagnosis not present

## 2017-10-01 DIAGNOSIS — R000 Tachycardia, unspecified: Secondary | ICD-10-CM | POA: Diagnosis not present

## 2017-10-01 DIAGNOSIS — Z66 Do not resuscitate: Secondary | ICD-10-CM | POA: Diagnosis not present

## 2017-10-01 DIAGNOSIS — C799 Secondary malignant neoplasm of unspecified site: Secondary | ICD-10-CM | POA: Diagnosis not present

## 2017-10-01 DIAGNOSIS — R160 Hepatomegaly, not elsewhere classified: Secondary | ICD-10-CM | POA: Diagnosis not present

## 2017-10-01 LAB — PH, BODY FLUID: pH, Body Fluid: 7.6

## 2017-10-01 MED ORDER — MEGESTROL ACETATE 400 MG/10ML PO SUSP: 400.0000 mg | Freq: Every day | ORAL | 0 refills | Status: DC

## 2017-10-01 MED ORDER — ENSURE ENLIVE PO LIQD: 237.0000 mL | Freq: Three times a day (TID) | ORAL | 12 refills | Status: DC

## 2017-10-01 NOTE — Discharge Summary (Addendum)
[facility] [place] NAME: Suzanne Jordan    MR#:  [id number]  DATE OF BIRTH:  1945/06/08  DATE OF ADMISSION:  09/27/2017 ADMITTING PHYSICIAN: Lance Coon, MD  DATE OF DISCHARGE: 10/01/2017  PRIMARY CARE PHYSICIAN: Guadalupe Maple, MD    ADMISSION DIAGNOSIS:  Dehydration [E86.0] Elevated lactic acid level [R79.89]  DISCHARGE DIAGNOSIS:  Malignant Asictes--s/p paracentesis 5.45 liters removed on 09/30/2017 Liver metastasis--primary unknown--liver biopsy as out pt per Dr Mike Gip FTT/weakness  SECONDARY DIAGNOSIS:   Past Medical History:  Diagnosis Date  . Breast cancer ([place]) 2010   lumpectomy  . Cancer ([place]) 2010   T1c, N0, ER/ PR +. 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   . Cerebral palsy ([place])    at birth    HOSPITAL COURSE:   72 year old female with past medical history significant for history of breast cancer, hypertension, cerebral palsy brought from home due to weakness, malaise  1. Weakness, failure to thrive- ? Concern for worsening underlying malignancy -Recent liver ultrasound showing some liver masses.    -CT abd done as inpatient showing significant ascites and multiple liver lesions -Dr Mike Gip from  oncology consulted- for liver biopsy next week scheduled - paracentesis done- cytology mixed epithelial cells--no malignant cells seen - palliative care consulted--pt is DNR now -Physical therapy consult- recommended rehab  - megace added for appetite  2.  Tachycardia-sinus tachycardia.   -HR 80's BP soft D/c BB  3.  Chronic hypotension-continue low-dose Midodrine  4.  DVT prophylaxis-on Lovenox   Living will paperwork completed.  Patient opted her cousin Holley Raring  to be her healthcare power of attorney. Pt is DNR  Patient will need rehab at discharge discharge patient to [facility]. Palliative care to follow at discharge.  CONSULTS OBTAINED:  Treatment Team:  Lequita Asal, MD  DRUG  ALLERGIES:  No Known Allergies  DISCHARGE MEDICATIONS:   Allergies as of 10/01/2017   No Known Allergies     Medication List    TAKE these medications   diazepam 10 MG tablet Commonly known as:  VALIUM Take 1 tablet (10 mg total) by mouth every 12 (twelve) hours as needed for anxiety.   feeding supplement (ENSURE ENLIVE) Liqd Take 237 mLs by mouth 3 (three) times daily between meals.   fluticasone 50 MCG/ACT nasal spray Commonly known as:  FLONASE Place 2 sprays into both nostrils daily.   megestrol 400 MG/10ML suspension Commonly known as:  MEGACE Take 10 mLs (400 mg total) by mouth daily.   midodrine 2.5 MG tablet Commonly known as:  PROAMATINE Take 1 tablet (2.5 mg total) by mouth 2 (two) times daily with a meal. Hold if BP >419 systolic   multivitamin capsule Take 1 capsule by mouth daily.       If you experience worsening of your admission symptoms, develop shortness of breath, life threatening emergency, suicidal or homicidal thoughts you must seek medical attention immediately by calling 911 or calling your MD immediately  if symptoms less severe.  You Must read complete instructions/literature along with all the possible adverse reactions/side effects for all the Medicines you take and that have been prescribed to you. Take any new Medicines after you have completely understood and accept all the possible adverse reactions/side effects.   Please note  You were cared for by a hospitalist during your hospital stay. If you have any questions about your discharge medications or the care you received while you were in the hospital after  you are discharged, you can call the unit and asked to speak with the hospitalist on call if the hospitalist that took care of you is not available. Once you are discharged, your primary care physician will handle any further medical issues. Please note that NO REFILLS for any discharge medications will be authorized once you are  discharged, as it is imperative that you return to your primary care physician (or establish a relationship with a primary care physician if you do not have one) for your aftercare needs so that they can reassess your need for medications and monitor your lab values. Today   SUBJECTIVE   Doing well. Eating breakfast  VITAL SIGNS:  Blood pressure 98/67, pulse 89, temperature 98.1 F (36.7 C), temperature source Oral, resp. rate 16, height 5\' 2"  (1.575 m), weight 55.7 kg (122 lb 14.4 oz), SpO2 97 %.  I/O:  No intake or output data in the 24 hours ending 10/01/17 0929  PHYSICAL EXAMINATION:  GENERAL:  72 y.o.-year-old patient lying in the bed with no acute distress. Thin, cachectic EYES: Pupils equal, round, reactive to light and accommodation. No scleral icterus. Extraocular muscles intact.  HEENT: Head atraumatic, normocephalic. Oropharynx and nasopharynx clear.  NECK:  Supple, no jugular venous distention. No thyroid enlargement, no tenderness.  LUNGS: Normal breath sounds bilaterally, no wheezing, rales,rhonchi or crepitation. No use of accessory muscles of respiration.  CARDIOVASCULAR: S1, S2 normal. No murmurs, rubs, or gallops.  ABDOMEN: Soft, non-tender, mild distended. Bowel sounds present. No organomegaly or mass.  EXTREMITIES: No pedal edema, cyanosis, or clubbing.  NEUROLOGIC: Cranial nerves II through XII are intact. Muscle strength 5/5 in all extremities. Sensation intact. Gait not checked.  PSYCHIATRIC: The patient is alert and oriented x 3.  SKIN: No obvious rash, lesion, or ulcer.   DATA REVIEW:   CBC  Recent Labs  Lab 09/28/17 0542  WBC 9.7  HGB 12.4  HCT 37.1  PLT 309    Chemistries  Recent Labs  Lab 09/28/17 0403 09/29/17 1022  NA 136  --   K 4.4  --   CL 109  --   CO2 17*  --   GLUCOSE 113*  --   BUN 23*  --   CREATININE 1.13*  --   CALCIUM 8.0*  --   AST  --  103*  ALT  --  56*  ALKPHOS  --  497*  BILITOT  --  2.5*    Microbiology Results    Recent Results (from the past 240 hour(s))  Blood culture (routine x 2)     Status: None (Preliminary result)   Collection Time: 09/27/17  7:27 PM  Result Value Ref Range Status   Specimen Description BLOOD RIGHT ANTECUBITAL  Final   Special Requests   Final    BOTTLES DRAWN AEROBIC AND ANAEROBIC Blood Culture adequate volume   Culture   Final    NO GROWTH 4 DAYS Performed at [facility], [address] [place], [place] [address]    Report Status PENDING  Incomplete  Blood culture (routine x 2)     Status: None (Preliminary result)   Collection Time: 09/27/17  7:27 PM  Result Value Ref Range Status   Specimen Description BLOOD LEFT ANTECUBITAL  Final   Special Requests   Final    BOTTLES DRAWN AEROBIC AND ANAEROBIC Blood Culture adequate volume   Culture   Final    NO GROWTH 4 DAYS Performed at [facility], [address],  [place], [place] [address]    Report Status PENDING  Incomplete  Urine Culture     Status: None   Collection Time: 09/27/17  7:27 PM  Result Value Ref Range Status   Specimen Description   Final    URINE, RANDOM Performed at [facility], [address] [place], [place] [address]    Special Requests   Final    NONE Performed at [facility], [address] [place], [place] [address]    Culture   Final    NO GROWTH Performed at [facility], [place] [address], [place], [place] [address]    Report Status 09/29/2017 FINAL  Final  Body fluid culture     Status: None (Preliminary result)   Collection Time: 09/29/17 11:20 AM  Result Value Ref Range Status   Specimen Description   Final    PERITONEAL Performed at [facility], [address], [place], [place] [address]    Special Requests   Final    PERITONEAL Performed at [facility], [address] [place], [place] [address]    Gram Stain   Final    WBC PRESENT,BOTH PMN AND MONONUCLEAR NO ORGANISMS SEEN CYTOSPIN SMEAR     Culture   Final    NO GROWTH 2 DAYS Performed at [facility], [place] [address] [place],  [address]    Report Status PENDING  Incomplete    RADIOLOGY:  US Paracentesis  Result Date: 09/29/2017 INDICATION: Ascites EXAM: ULTRASOUND GUIDED PARACENTESIS MEDICATIONS: None. COMPLICATIONS: None immediate. PROCEDURE: Informed written consent was obtained from the patient after a discussion of the risks, benefits and alternatives to treatment. A timeout was performed prior to the initiation of the procedure. Initial ultrasound scanning demonstrates a large amount of ascites within the right lower abdominal quadrant. The right lower abdomen was prepped and draped in the usual sterile fashion. 1% lidocaine with epinephrine was used for local anesthesia. Following this, a 6 Fr Safe-T-Centesis catheter was introduced. An ultrasound image was saved for documentation purposes. The paracentesis was performed. The catheter was removed and a dressing was applied. The patient tolerated the procedure well without immediate post procedural complication. FINDINGS: A total of approximately 5.45 L of clear yellow fluid was removed. Samples were sent to the laboratory as requested by the clinical team. IMPRESSION: Successful ultrasound-guided paracentesis yielding 5.45 liters of peritoneal fluid. Electronically Signed   By: Inez Catalina M.D.   On: 09/29/2017 13:33     Management plans discussed with the patient, family and they are in agreement.  CODE STATUS:     Code Status Orders  (From admission, onward)        Start     Ordered   09/30/17 1543  Do not attempt resuscitation (DNR)  Continuous    Question Answer Comment  In the event of cardiac or respiratory ARREST Do not call a "code blue"   In the event of cardiac or respiratory ARREST Do not perform Intubation, CPR, defibrillation or ACLS   In the event of cardiac or respiratory ARREST Use medication by any route, position, wound care, and other  measures to relive pain and suffering. May use oxygen, suction and manual treatment of airway obstruction as needed for comfort.      09/30/17 1542    Code Status History    Date Active Date Inactive Code Status Order ID Comments User Context   09/27/2017 2353 09/30/2017 1542 Full Code [id number]  Lance Coon, MD  Inpatient   09/20/2017 0007 09/21/2017 1757 Full Code [id number]  Amelia Jo, MD Inpatient    Advance Directive Documentation     Most Recent Value  Type of Advance Directive  Healthcare Power of [place], Living will  Pre-existing out of facility DNR order (yellow form or pink MOST form)  -  "MOST" Form in Place?  -      TOTAL TIME TAKING CARE OF THIS PATIENT: *40* minutes.    Fritzi Mandes M.D on 10/01/2017 at 9:29 AM  Between 7am to 6pm - Pager - [phone] After 6pm go to [email or web address] - password [email or web address] [place] [facility]  Office  [phone]  CC: Primary care physician; Guadalupe [place], MD

## 2017-10-01 NOTE — Progress Notes (Signed)
Pt A&Ox3 with minimal confusion. VS stable. Report called to [facility], Adult nurse. EMS called to transport patient to [facility]. IVs removed. Pt's POA has been informed per social work. Will continue to monitor until discharge. Kyla Balzarine, LPN

## 2017-10-01 NOTE — Progress Notes (Signed)
New referral for outpatient palliative to follow at [facility] to follow at [facility] received from [facility]. Plan is for discharge today. Patient information faxed to referral. Flo Shanks RN, BSN, [facility] [place], hospital liaison [phone]

## 2017-10-01 NOTE — Care Management Important Message (Signed)
Important Message  Patient Details  Name: Suzanne Jordan MRN: [id number] Date of Birth: [birth year]   Medicare Important Message Given:  Yes    Juliann Pulse A Rhodes [place] 10/01/2017, 11:06 AM

## 2017-10-01 NOTE — Clinical Social Work Note (Signed)
Patient is medically ready for discharge to [facility] today. CSW notified patient's POA  [name] [phone] of discharge today. CSW notified Magda Paganini at [facility] of patient discharging today. Patient will be transported by EMS. RN will call report and call for transport  Annamaria Boots MSW, [place]

## 2017-10-02 DIAGNOSIS — I951 Orthostatic hypotension: Secondary | ICD-10-CM | POA: Diagnosis not present

## 2017-10-02 DIAGNOSIS — R180 Malignant ascites: Secondary | ICD-10-CM | POA: Diagnosis not present

## 2017-10-02 DIAGNOSIS — G808 Other cerebral palsy: Secondary | ICD-10-CM | POA: Diagnosis not present

## 2017-10-02 DIAGNOSIS — E440 Moderate protein-calorie malnutrition: Secondary | ICD-10-CM | POA: Diagnosis not present

## 2017-10-02 DIAGNOSIS — C799 Secondary malignant neoplasm of unspecified site: Secondary | ICD-10-CM | POA: Diagnosis not present

## 2017-10-02 LAB — CULTURE, BLOOD (ROUTINE X 2)
CULTURE: NO GROWTH
CULTURE: NO GROWTH
Special Requests: ADEQUATE
Special Requests: ADEQUATE

## 2017-10-02 LAB — CHOLESTEROL, BODY FLUID: Cholesterol, Fluid: 25 mg/dL

## 2017-10-03 LAB — TOTAL BILIRUBIN, BODY FLUID: Total bilirubin, fluid: 0.4 mg/dL

## 2017-10-03 LAB — BODY FLUID CULTURE: Culture: NO GROWTH

## 2017-10-04 ENCOUNTER — Other Ambulatory Visit: Payer: Self-pay | Admitting: Radiology

## 2017-10-05 ENCOUNTER — Telehealth: Payer: Self-pay | Admitting: *Deleted

## 2017-10-05 NOTE — Telephone Encounter (Signed)
Received incoming call from [facility] in speciality scheduling. Calling to confirm whether [facility] is aware of the time chg for the biopsy tom.  I contacted the facility to confirm that patient will arrive at 8 am for her scheduled biopsy for a 9 am bx time.  They had her on the schedule to be dropped off at 9 am. The nurse at [facility] will have this time adjusted on the van driver's schedule.

## 2017-10-06 ENCOUNTER — Ambulatory Visit
Admission: RE | Admit: 2017-10-06 | Discharge: 2017-10-06 | Disposition: A | Discharge status: Home / Self Care | Payer: Medicare Other | Source: Ambulatory Visit | Attending: Urgent Care | Admitting: Urgent Care

## 2017-10-06 ENCOUNTER — Ambulatory Visit
Admit: 2017-10-06 | Discharge: 2017-10-06 | Disposition: A | Discharge status: Home / Self Care | Payer: Medicare Other | Attending: Urgent Care | Admitting: Urgent Care

## 2017-10-06 ENCOUNTER — Other Ambulatory Visit (HOSPITAL_COMMUNITY): Payer: Self-pay | Admitting: Interventional Radiology

## 2017-10-06 ENCOUNTER — Ambulatory Visit
Admission: RE | Admit: 2017-10-06 | Discharge: 2017-10-06 | Disposition: A | Discharge status: Home / Self Care | Payer: Medicare Other | Source: Ambulatory Visit | Attending: Interventional Radiology | Admitting: Interventional Radiology

## 2017-10-06 DIAGNOSIS — K7689 Other specified diseases of liver: Secondary | ICD-10-CM | POA: Diagnosis not present

## 2017-10-06 DIAGNOSIS — Z853 Personal history of malignant neoplasm of breast: Secondary | ICD-10-CM | POA: Diagnosis not present

## 2017-10-06 DIAGNOSIS — R160 Hepatomegaly, not elsewhere classified: Secondary | ICD-10-CM | POA: Diagnosis not present

## 2017-10-06 DIAGNOSIS — C787 Secondary malignant neoplasm of liver and intrahepatic bile duct: Secondary | ICD-10-CM | POA: Insufficient documentation

## 2017-10-06 DIAGNOSIS — R188 Other ascites: Secondary | ICD-10-CM

## 2017-10-06 LAB — CBC
HEMATOCRIT: 44 % (ref 35.0–47.0)
Hemoglobin: 14.2 g/dL (ref 12.0–16.0)
MCH: 31.9 pg (ref 26.0–34.0)
MCHC: 32.2 g/dL (ref 32.0–36.0)
MCV: 99.1 fL (ref 80.0–100.0)
PLATELETS: 406 10*3/uL (ref 150–440)
RBC: 4.44 MIL/uL (ref 3.80–5.20)
RDW: 19 % — AB (ref 11.5–14.5)
WBC: 13.6 10*3/uL — AB (ref 3.6–11.0)

## 2017-10-06 LAB — PROTIME-INR
INR: 0.97
Prothrombin Time: 12.8 seconds (ref 11.4–15.2)

## 2017-10-06 MED ORDER — HYDROCODONE-ACETAMINOPHEN 5-325 MG PO TABS: 1.0000 | ORAL_TABLET | ORAL | Status: DC | PRN

## 2017-10-06 MED ORDER — MIDAZOLAM HCL 2 MG/2ML IJ SOLN
INTRAMUSCULAR | Status: AC
  Filled 2017-10-06: qty 2

## 2017-10-06 MED ORDER — FENTANYL CITRATE (PF) 100 MCG/2ML IJ SOLN
INTRAMUSCULAR | Status: AC
  Filled 2017-10-06: qty 2

## 2017-10-06 MED ORDER — FENTANYL CITRATE (PF) 100 MCG/2ML IJ SOLN
INTRAMUSCULAR | Status: AC | PRN
  Administered 2017-10-06 (×2): 25 ug via INTRAVENOUS

## 2017-10-06 MED ORDER — MIDAZOLAM HCL 2 MG/2ML IJ SOLN
INTRAMUSCULAR | Status: AC | PRN
  Administered 2017-10-06 (×2): 0.5 mg via INTRAVENOUS

## 2017-10-06 MED ORDER — SODIUM CHLORIDE 0.9 % IV SOLN
INTRAVENOUS | Status: DC
  Administered 2017-10-06: 09:00:00 via INTRAVENOUS

## 2017-10-06 NOTE — Discharge Instructions (Signed)
Moderate Conscious Sedation, Adult, Care After °These instructions provide you with information about caring for yourself after your procedure. Your health care provider may also give you more specific instructions. Your treatment has been planned according to current medical practices, but problems sometimes occur. Call your health care provider if you have any problems or questions after your procedure. °What can I expect after the procedure? °After your procedure, it is common: °· To feel sleepy for several hours. °· To feel clumsy and have poor balance for several hours. °· To have poor judgment for several hours. °· To vomit if you eat too soon. ° °Follow these instructions at home: °For at least 24 hours after the procedure: ° °· Do not: °? Participate in activities where you could fall or become injured. °? Drive. °? Use heavy machinery. °? Drink alcohol. °? Take sleeping pills or medicines that cause drowsiness. °? Make important decisions or sign legal documents. °? Take care of children on your own. °· Rest. °Eating and drinking °· Follow the diet recommended by your health care provider. °· If you vomit: °? Drink water, juice, or soup when you can drink without vomiting. °? Make sure you have little or no nausea before eating solid foods. °General instructions °· Have a responsible adult stay with you until you are awake and alert. °· Take over-the-counter and prescription medicines only as told by your health care provider. °· If you smoke, do not smoke without supervision. °· Keep all follow-up visits as told by your health care provider. This is important. °Contact a health care provider if: °· You keep feeling nauseous or you keep vomiting. °· You feel light-headed. °· You develop a rash. °· You have a fever. °Get help right away if: °· You have trouble breathing. °This information is not intended to replace advice given to you by your health care provider. Make sure you discuss any questions you have  with your health care provider. °Document Released: 02/02/2013 Document Revised: 09/17/2015 Document Reviewed: 08/04/2015 °Elsevier Interactive Patient Education © 2018 Elsevier Inc. ° ° °Liver Biopsy, Care After °These instructions give you information on caring for yourself after your procedure. Your doctor may also give you more specific instructions. Call your doctor if you have any problems or questions after your procedure. °Follow these instructions at home: °· Rest at home for 1-2 days or as told by your doctor. °· Have someone stay with you for at least 24 hours. °· Do not do these things in the first 24 hours: °? Drive. °? Use machinery. °? Take care of other people. °? Sign legal documents. °? Take a bath or shower. °· There are many different ways to close and cover a cut (incision). For example, a cut can be closed with stitches, skin glue, or adhesive strips. Follow your doctor's instructions on: °? Taking care of your cut. °? Changing and removing your bandage (dressing). °? Removing whatever was used to close your cut. °· Do not drink alcohol in the first week. °· Do not lift more than 5 pounds or play contact sports for the first 2 weeks. °· Take medicines only as told by your doctor. For 1 week, do not take medicine that has aspirin in it or medicines like ibuprofen. °· Get your test results. °Contact a doctor if: °· A cut bleeds and leaves more than just a small spot of blood. °· A cut is red, puffs up (swells), or hurts more than before. °· Fluid or something else   comes from a cut. °· A cut smells bad. °· You have a fever or chills. °Get help right away if: °· You have swelling, bloating, or pain in your belly (abdomen). °· You get dizzy or faint. °· You have a rash. °· You feel sick to your stomach (nauseous) or throw up (vomit). °· You have trouble breathing, feel short of breath, or feel faint. °· Your chest hurts. °· You have problems talking or seeing. °· You have trouble balancing or moving  your arms or legs. °This information is not intended to replace advice given to you by your health care provider. Make sure you discuss any questions you have with your health care provider. °Document Released: 01/22/2008 Document Revised: 09/20/2015 Document Reviewed: 06/10/2013 °Elsevier Interactive Patient Education © 2018 Elsevier Inc. ° ° °

## 2017-10-06 NOTE — Progress Notes (Signed)
Patient remains clincally stable post paracentesis and liver biopsy per Dr Vernard Gambles. Has bilateral bandade dressings which remain dry and intact. Denies complaints at this time. Sinus rhythm per monitor. Vitals remained stable. Taking ;po's without difficulty. Report called to patient's care nurse at [facility] with discharge instructions with questions answered. Pt on bedrest here until 1245 after which she may be discharged back to [facility] per [facility].

## 2017-10-06 NOTE — Procedures (Signed)
  Procedure: 1. US paracentesis 4.9l   2. [place] core liver biopsy 18g x2 EBL:   minimal Complications:  none immediate  See full dictation in [facility].  Dillard Cannon MD Main # [phone] Pager  [phone]

## 2017-10-06 NOTE — Discharge Instructions (Signed)
Liver Biopsy, Care After These instructions give you information on caring for yourself after your procedure. Your doctor may also give you more specific instructions. Call your doctor if you have any problems or questions after your procedure. Follow these instructions at home:  Rest at home for 1-2 days or as told by your doctor.  Have someone stay with you for at least 24 hours.  Do not do these things in the first 24 hours: ? Drive. ? Use machinery. ? Take care of other people. ? Sign legal documents. ? Take a bath or shower.  There are many different ways to close and cover a cut (incision). For example, a cut can be closed with stitches, skin glue, or adhesive strips. Follow your doctor's instructions on: ? Taking care of your cut. ? Changing and removing your bandage (dressing). ? Removing whatever was used to close your cut.  Do not drink alcohol in the first week.  Do not lift more than 5 pounds or play contact sports for the first 2 weeks.  Take medicines only as told by your doctor. For 1 week, do not take medicine that has aspirin in it or medicines like ibuprofen.  Get your test results. Contact a doctor if:  A cut bleeds and leaves more than just a small spot of blood.  A cut is red, puffs up (swells), or hurts more than before.  Fluid or something else comes from a cut.  A cut smells bad.  You have a fever or chills. Get help right away if:  You have swelling, bloating, or pain in your belly (abdomen).  You get dizzy or faint.  You have a rash.  You feel sick to your stomach (nauseous) or throw up (vomit).  You have trouble breathing, feel short of breath, or feel faint.  Your chest hurts.  You have problems talking or seeing.  You have trouble balancing or moving your arms or legs. This information is not intended to replace advice given to you by your health care provider. Make sure you discuss any questions you have with your health care  provider. Document Released: 01/22/2008 Document Revised: 09/20/2015 Document Reviewed: 06/10/2013 Elsevier Interactive Patient Education  2018 [employer]. Paracentesis, Care After Refer to this sheet in the next few weeks. These instructions provide you with information about caring for yourself after your procedure. Your health care provider may also give you more specific instructions. Your treatment has been planned according to current medical practices, but problems sometimes occur. Call your health care provider if you have any problems or questions after your procedure. What can I expect after the procedure? After your procedure, it is common to have a small amount of clear fluid coming from the puncture site. Follow these instructions at home:  Return to your normal activities as told by your health care provider. Ask your health care provider what activities are safe for you.  Take over-the-counter and prescription medicines only as told by your health care provider.  Do not take baths, swim, or use a hot tub until your health care provider approves.  Follow instructions from your health care provider about: ? How to take care of your puncture site. ? When and how you should change your bandage (dressing). ? When you should remove your dressing.  Check your puncture area every day signs of infection. Watch for: ? Redness, swelling, or pain. ? Fluid, blood, or pus.  Keep all follow-up visits as told by your health care  provider. This is important. Contact a health care provider if:  You have redness, swelling, or pain at your puncture site.  You start to have more clear fluid coming from your puncture site.  You have blood or pus coming from your puncture site.  You have chills.  You have a fever. Get help right away if:  You develop chest pain or shortness of breath.  You develop increasing pain, discomfort, or swelling in your abdomen.  You feel dizzy or  light-headed or you pass out. This information is not intended to replace advice given to you by your health care provider. Make sure you discuss any questions you have with your health care provider. Document Released: 08/29/2014 Document Revised: 09/20/2015 Document Reviewed: 06/27/2014 Elsevier Interactive Patient Education  2018 [employer].

## 2017-10-07 DIAGNOSIS — C229 Malignant neoplasm of liver, not specified as primary or secondary: Secondary | ICD-10-CM | POA: Diagnosis not present

## 2017-10-07 DIAGNOSIS — F411 Generalized anxiety disorder: Secondary | ICD-10-CM | POA: Diagnosis not present

## 2017-10-07 DIAGNOSIS — I959 Hypotension, unspecified: Secondary | ICD-10-CM | POA: Diagnosis not present

## 2017-10-07 DIAGNOSIS — R627 Adult failure to thrive: Secondary | ICD-10-CM | POA: Diagnosis not present

## 2017-10-09 ENCOUNTER — Telehealth: Payer: Self-pay | Admitting: *Deleted

## 2017-10-09 NOTE — Telephone Encounter (Signed)
Per Gaspar Bidding 10/08/17 staff message : RTC [place] Bx Results  Patient is scheduled for Monday @ 2:15 a requested.  I spoke to the patient and made her aware and also I spoke to  [facility] her POA and made her aware as well.

## 2017-10-12 ENCOUNTER — Telehealth: Payer: Self-pay | Admitting: *Deleted

## 2017-10-12 ENCOUNTER — Inpatient Hospital Stay: Payer: Medicare Other | Admitting: Hematology and Oncology

## 2017-10-12 NOTE — Telephone Encounter (Signed)
I spoke with Suzanne Jordan and I spoke with [facility] then to the transport driver at [facility] and patient appointment rescheduled for tomorrow at 1115. [name] notified of appointment time as she is patient POA

## 2017-10-12 NOTE — Telephone Encounter (Signed)
  Please call [facility].  Report will not be faxed.  PATIENT NEEDS TO COME THIS WEEK TO DETERMINE TREATMENT PLAN.  m

## 2017-10-12 NOTE — Telephone Encounter (Signed)
[name] called and states patient now residing at [facility] and they are unable to transport her to her appointment today. [facility] is though asking that her results she was to get from [place] at today's appointment be faxed to them. Please advise

## 2017-10-13 ENCOUNTER — Encounter: Payer: Self-pay | Admitting: Hematology and Oncology

## 2017-10-13 ENCOUNTER — Other Ambulatory Visit: Payer: Self-pay | Admitting: Hematology and Oncology

## 2017-10-13 ENCOUNTER — Inpatient Hospital Stay: Payer: Medicare Other | Attending: Hematology and Oncology | Admitting: Hematology and Oncology

## 2017-10-13 VITALS — BP 102/71 | HR 117 | Temp 96.6°F | Resp 16 | Wt 109.0 lb

## 2017-10-13 DIAGNOSIS — Z853 Personal history of malignant neoplasm of breast: Secondary | ICD-10-CM | POA: Insufficient documentation

## 2017-10-13 DIAGNOSIS — Z170 Estrogen receptor positive status [ER+]: Secondary | ICD-10-CM

## 2017-10-13 DIAGNOSIS — G809 Cerebral palsy, unspecified: Secondary | ICD-10-CM | POA: Diagnosis not present

## 2017-10-13 DIAGNOSIS — C50411 Malignant neoplasm of upper-outer quadrant of right female breast: Secondary | ICD-10-CM

## 2017-10-13 DIAGNOSIS — R180 Malignant ascites: Secondary | ICD-10-CM | POA: Diagnosis not present

## 2017-10-13 DIAGNOSIS — R5383 Other fatigue: Secondary | ICD-10-CM | POA: Insufficient documentation

## 2017-10-13 DIAGNOSIS — Z79899 Other long term (current) drug therapy: Secondary | ICD-10-CM

## 2017-10-13 DIAGNOSIS — Z66 Do not resuscitate: Secondary | ICD-10-CM | POA: Insufficient documentation

## 2017-10-13 DIAGNOSIS — R531 Weakness: Secondary | ICD-10-CM | POA: Insufficient documentation

## 2017-10-13 DIAGNOSIS — Z923 Personal history of irradiation: Secondary | ICD-10-CM | POA: Diagnosis not present

## 2017-10-13 DIAGNOSIS — C787 Secondary malignant neoplasm of liver and intrahepatic bile duct: Secondary | ICD-10-CM | POA: Insufficient documentation

## 2017-10-13 DIAGNOSIS — Z7189 Other specified counseling: Secondary | ICD-10-CM

## 2017-10-13 DIAGNOSIS — R5381 Other malaise: Secondary | ICD-10-CM | POA: Diagnosis not present

## 2017-10-13 DIAGNOSIS — Z9223 Personal history of estrogen therapy: Secondary | ICD-10-CM | POA: Diagnosis not present

## 2017-10-13 NOTE — Progress Notes (Signed)
[facility] day:  10/13/2017  Chief Complaint: Suzanne Jordan is a 72 y.o. female with a history of stage I right breast cancer and recently diagnosed liver masses who is seen for assessment after interval liver biopsy and discussion regarding direction of therapy.  HPI: The patient was last seen in the medical oncology clinic on 09/04/2017 for new patient assessment.  She presented with increasing LFTs and liver lesions noted on CT.  We discussed biopsy.  Biopsy was scheduled then rescheduled.  She was admitted to [facility] from 09/27/2017 - 10/01/2017 with malignant ascites, failure to thrive and weakness.  She underwent paracentesis of 5.45 liters on 09/29/2017.  Cytology was negative.  She underwent paracentesis of 4.9 Lliters and liver biopsy on 10/06/2017.  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  Tumor was ER + (>90%), PR + (1-10%) and Her2/neu 3+.  During the interim, patient is doing well today, and does not express any acute concerns. Patient denies B symptoms. She has not experienced any significant interval infections. Patient notes that she is eating well. Weight has increased by 4 pounds.   Patient denies pain in the clinic today.   Past Medical History:  Diagnosis Date  . Breast cancer ([place]) 2010   lumpectomy  . Cancer ([place]) 2010   T1c, N0, ER/ PR +. 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   . 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 ABDOMINAL HYSTERECTOMY  1970's  . BREAST LUMPECTOMY Right 2010  . BREAST SURGERY Right 2010   wide excision with sn biopsy  . CATARACT EXTRACTION    . FRACTURE SURGERY Right    right leg, as a child  . TUBAL LIGATION      Family History  Problem Relation Age of Onset  . Stroke Father        passed Jan 2014 age 87  . Cancer Other        colon,breast,ovarian cancers, relationships not listed  . Breast cancer Neg Hx      Social History:  reports that she has never smoked. She has never used smokeless tobacco. She reports that she does not drink alcohol or use drugs. Patient previously lived alone in [place]. Son stays with her when he is in town. She is currently at [facility].  The patient is accompanied by Suzanne Jordan, MPOA,  today.  Allergies: No Known Allergies  Current Medications: Current Outpatient Medications  Medication Sig Dispense Refill  . diazepam (VALIUM) 10 MG tablet Take 1 tablet (10 mg total) by mouth every 12 (twelve) hours as needed for anxiety. 60 tablet 1  . feeding supplement, ENSURE ENLIVE, (ENSURE ENLIVE) LIQD Take 237 mLs by mouth 3 (three) times daily between meals. 237 mL 12  . fluticasone (FLONASE) 50 MCG/ACT nasal spray Place 2 sprays into both nostrils daily. 16 g 12  . megestrol (MEGACE) 400 MG/10ML suspension Take 10 mLs (400 mg total) by mouth daily. 240 mL 0  . midodrine (PROAMATINE) 2.5 MG tablet Take 1 tablet (2.5 mg total) by mouth 2 (two) times daily with a meal. Hold if BP >027 systolic 30 tablet 1  . Multiple Vitamin (MULTIVITAMIN) capsule Take 1 capsule by mouth daily.     No current facility-administered medications for this visit.     Review of Systems  Constitutional: Positive for malaise/fatigue. Negative for diaphoresis, fever and weight loss.  HENT: Negative.  Negative for congestion, nosebleeds and sinus pain.   Eyes: Positive for blurred vision. Negative for double vision, pain and discharge.  Respiratory: Negative for cough, hemoptysis, sputum production and shortness of breath.   Cardiovascular: Positive for leg swelling. Negative for chest pain, palpitations, orthopnea and PND.  Gastrointestinal: Negative for abdominal pain, blood in stool, constipation, diarrhea, melena, nausea and vomiting.       Appetite poor.  Genitourinary: Negative for dysuria, frequency, hematuria and urgency.  Musculoskeletal: Positive for falls and neck pain.  Negative for back pain, joint pain and myalgias.       Muscle spasticity related to cerebral palsy diagnosis; uses Baclofen pump  Skin: Negative.  Negative for itching and rash.  Neurological: Positive for weakness. Negative for dizziness, tremors and headaches.       Cerebral palsy; No use of right arm  Endo/Heme/Allergies: Does not bruise/bleed easily.  Psychiatric/Behavioral: Negative for depression, hallucinations and suicidal ideas. The patient is not nervous/anxious and does not have insomnia.   All other systems reviewed and are negative.  Physical Exam: Blood pressure 102/71, pulse (!) 117, temperature (!) 96.6 F (35.9 C), temperature source Tympanic, resp. rate 16, weight 109 lb (49.4 kg). GENERAL:  Thin woman sitting comfortably in a wheelchair under a blanket in the exam room in no acute distress. MENTAL STATUS:  Alert and oriented to person, place and time. HEAD:  Short gray hair.  Normocephalic, atraumatic, face symmetric, no Cushingoid features. EYES:  Glasses.  Blue eyes.  Pupils equal round and reactive to light and accomodation.  No conjunctivitis or scleral icterus. ENT:  Oropharynx clear without lesion.  Tongue normal. Mucous membranes moist.  RESPIRATORY:  Clear to auscultation without rales, wheezes or rhonchi. CARDIOVASCULAR:  Regular rate and rhythm without murmur, rub or gallop. ABDOMEN:  Soft, non-tender, with active bowel sounds, and no hepatosplenomegaly.  No masses. SKIN:  Baclofen pump in right lower abdomen.  No rashes, ulcers or lesions. EXTREMITIES: Right arm flexed.  Bilateral 2+ lower extremity edema.  No skin discoloration or tenderness.  No palpable cords. LYMPH NODES: No palpable cervical, supraclavicular, axillary or inguinal adenopathy  NEUROLOGICAL: Cerebral palsy. PSYCH:  Appropriate.   No visits with results within 3 Day(s) from this visit.  Latest known visit with results is:  Hospital Outpatient Visit on 10/06/2017  Component Date Value Ref  Range Status  . WBC 10/06/2017 13.6* 3.6 - 11.0 K/uL Final  . RBC 10/06/2017 4.44  3.80 - 5.20 MIL/uL Final  . Hemoglobin 10/06/2017 14.2  12.0 - 16.0 g/dL Final  . HCT 10/06/2017 44.0  35.0 - 47.0 % Final  . MCV 10/06/2017 99.1  80.0 - 100.0 fL Final  . MCH 10/06/2017 31.9  26.0 - 34.0 pg Final  . MCHC 10/06/2017 32.2  32.0 - 36.0 g/dL Final  . RDW 10/06/2017 19.0* 11.5 - 14.5 % Final  . Platelets 10/06/2017 406  150 - 440 K/uL Final   Performed at [facility], [address] [place], [place] [address]  . Prothrombin Time 10/06/2017 12.8  11.4 - 15.2 seconds Final  . INR 10/06/2017 0.97   Final   Performed at [facility], [address] [place], [place] [address]  . SURGICAL PATHOLOGY 10/06/2017    Final                   Value:Surgical Pathology CASE: [id number] PATIENT: [facility] Surgical Pathology Report     SPECIMEN SUBMITTED: A. Liver, bx  CLINICAL HISTORY: History of  invasive breast carcinoma in 2010, now with diffuse liver lesions and ascites. CEA - 1,926; CA 27.29 -  226; CA 15-3 - 179; AFP normal.  PRE-OPERATIVE DIAGNOSIS: Metastasis  POST-OPERATIVE DIAGNOSIS: None provided.     DIAGNOSIS: A. LIVER; ULTRASOUND-GUIDED CORE BIOPSY: - METASTATIC ADENOCARCINOMA CONSISTENT WITH BREAST ORIGIN.  Comment: Moderately differentiated adenocarcinoma is present, with predominantly trabecular/nested growth pattern, and focal glandular/tubular and cribriform patterns. Nuclear pleomorphism is moderate and mitotic rate is low. Immunohistochemistry (IHC) was performed for further characterization with the following results: GATA3: Positive, >90% of cells, strong nuclear staining Estrogen receptors (ER): Positive, >90% of cells, strong nuclear staining Progesterone receptor                         s (PR): Positive, 1-10% of cells, weak-moderate nuclear staining CDX2: Negative Chromogranin and Synaptophysin: Negative The morphology and  IHC profile are consistent with breast origin. The diagnosis was communicated to Dr. Mike Gip on 10/08/2017 at 1:34 PM.  HER2 status will be assessed on block A1 by IHC with reflex to [place] for 2+. The results will be reported in an addendum.  There is sufficient tissue for additional ancillary testing.  The slides from the previous right breast wide excision ([id number], 11/16/2008) were reviewed for morphologic correlation. The breast and liver tumors are cytologically similar. The growth patterns are different, which may reflect the dense stromal sclerosis in the breast. The breast carcinoma was ER positive (>90%, strong staining), PR positive (20%, weak staining), similar to the liver tumor. The breast carcinoma HER2 IHC score was 2+, and HER2 FISH was negative.  IHC slides on the liver biopsy were prepared by [facility]                         r for Molecular Biology and Pathology, [facility], [place], and interpreted by Dr. Dicie Beam. All controls stained appropriately.  This test was developed and its performance characteristics determined by [facility]. It has not been cleared or approved by the [place] Food and Drug Administration. The FDA does not require this test to go through premarket FDA review. This test is used for clinical purposes. It should not be regarded as investigational or for research. This laboratory is certified under the Clinical Laboratory Improvement Amendments (CLIA) as qualified to perform high complexity clinical laboratory testing.   GROSS DESCRIPTION: A. Labeled: Liver biopsy Received: In formalin Tissue fragment(s): 2 Size: 2.5 to 2.8 cm in length and in diameter 0.1 cm Description: Red to tan cores Entirely submitted in 2 cassettes.  Collected and placed in formalin at 9:40 am on 10/06/17.  Total formalin fixation time 8.5 hours.   Final Diagnosis performed by Bryan Lemma, MD.   Electronically                          signed 10/08/2017 3:43:35PM The  electronic signature indicates that the named Attending Pathologist has evaluated the specimen  Technical component performed at [facility], [address], [place], [place] [address] Lab: [phone] Dir: Rush Farmer, MD, MMM  Professional component performed at [facility], [facility], [address], [place], [place] [address] Lab: [phone] Dir: Dellia Nims. Reuel Derby, MD     Assessment:  VALLA PACEY is a 72 y.o. female with metastatic breast cancer.  She presented with increasing LFTs and liver lesions.  Liver biopsy on 10/06/2017 revealed metastatic adenocarcinoma c/w breast cancer.  Tumor was ER + (>  90%), PR + (1-10%) and Her2/neu 3+.  She has a history of stage I right breast cancer s/p wide excision with sentinel lymph node biopsy on 11/16/2008.  Pathology revealed a 1.5 cm grade II invasive carcinoma of the breast.  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  There was no lymph-vascular invasion.  Four sentinel lymph nodes were negative.  Tumor was ER + (> 90%), PR + (20%), and Her2/neu 2+ (FISH -). Pathologic stage was T1cN0.    Oncotype DX was 16 (low risk).  She received radiation and 5 years of Femara.   Bilateral screening mammogram on 08/27/2017 was negative.  Liver MRI on 07/09/2017 was severely degraded, nearly non-diagnostic.  The liver was nearly replaced with solid lesions which are favored to represent metastasis.  No primary was seen in the abdomen.  There was an equivocal lesion with the right side of T12.  Tumor markers included:  AFP (3.8), CA15-3 (178.6), CA27.29 (226.1) and CEA (1926).  She has recurrent ascites.  She underwent paracentesis of 5.45 liters on 09/29/2017 and 4.9 liters on 10/06/2017.  Cytology was negative.  She has cerebral palsy.  Performance status is 2.  Code status is DNR/DNI.  Symptomatically, she denies any new symptoms from baseline.  Exam stable.   Plan: 1. Discuss interval hospitalization.  Discuss paracentesis and liver biopsy.  Biopsy  confirms metastatic breast cancer. 2. Discuss patient's thoughts about treatment for metastatic disease. Patient wishes to pursue treatment. Discuss treatment with weekly trastuzumab, with the potential addition of Taxol based on tolerability of single agent therapy. Patient has a guarded prognosis. Proposed treatment with palliative intent. Discuss 20-30% response rate to immunotherapy alone. Side effects reviewed. Patient provided with printed information today on her AVS for review at home. Patient encouraged to make a list of questions and/or concerns for further discussion prior to beginning therapy using this medication.  She is "taking one step at a time". 3. Preauthorize trastuzumab and paclitaxel.  4. Review code status. Patient and HCPOA confirm DNR/DNI status.  5. Discuss port placement for weekly treatments. Patient in agreement. She wishes to have port placed by Dr. Bary Castilla.  6. Discuss plan for CNS imaging. 7. Schedule for chemotherapy education class.  8. RTC in 1 week for MD assessment, labs (CBC with diff, CMP), and trastuzumab (new).   Addendum:  Her2/neu later reported 2+ by IHC then [place] negative.  Patient is thus not a candidate for Her2/neu based therapy.     Honor Loh, NP  10/13/2017, 12:52 PM   I saw and evaluated the patient, participating in the key portions of the service and reviewing pertinent diagnostic studies and records.  I reviewed the nurse practitioner's note and agree with the findings and the plan.  The assessment and plan were discussed with the patient.  Multiple questions were asked by the patient and answered.   Nolon Stalls, MD 10/13/2017, 12:52 PM

## 2017-10-13 NOTE — Patient Instructions (Signed)
Implanted Port Home Guide An implanted port is a type of central line that is placed under the skin. Central lines are used to provide IV access when treatment or nutrition needs to be given through a person's veins. Implanted ports are used for long-term IV access. An implanted port may be placed because:  You need IV medicine that would be irritating to the small veins in your hands or arms.  You need long-term IV medicines, such as antibiotics.  You need IV nutrition for a long period.  You need frequent blood draws for lab tests.  You need dialysis. Implanted ports are usually placed in the chest area, but they can also be placed in the upper arm, the abdomen, or the leg. An implanted port has two main parts:  Reservoir. The reservoir is round and will appear as a small, raised area under your skin. The reservoir is the part where a needle is inserted to give medicines or draw blood.  Catheter. The catheter is a thin, flexible tube that extends from the reservoir. The catheter is placed into a large vein. Medicine that is inserted into the reservoir goes into the catheter and then into the vein. How will I care for my incision site? Do not get the incision site wet. Bathe or shower as directed by your health care provider. How is my port accessed? Special steps must be taken to access the port:  Before the port is accessed, a numbing cream can be placed on the skin. This helps numb the skin over the port site.  Your health care provider uses a sterile technique to access the port.  Your health care provider must put on a mask and sterile gloves.  The skin over your port is cleaned carefully with an antiseptic and allowed to dry.  The port is gently pinched between sterile gloves, and a needle is inserted into the port.  Only "non-coring" port needles should be used to access the port. Once the port is accessed, a blood return should be checked. This helps ensure that the port is  in the vein and is not clogged.  If your port needs to remain accessed for a constant infusion, a clear (transparent) bandage will be placed over the needle site. The bandage and needle will need to be changed every week, or as directed by your health care provider.  Keep the bandage covering the needle clean and dry. Do not get it wet. Follow your health care provider's instructions on how to take a shower or bath while the port is accessed.  If your port does not need to stay accessed, no bandage is needed over the port. What is flushing? Flushing helps keep the port from getting clogged. Follow your health care provider's instructions on how and when to flush the port. Ports are usually flushed with saline solution or a medicine called heparin. The need for flushing will depend on how the port is used.  If the port is used for intermittent medicines or blood draws, the port will need to be flushed:  After medicines have been given.  After blood has been drawn.  As part of routine maintenance.  If a constant infusion is running, the port may not need to be flushed. How long will my port stay implanted? The port can stay in for as long as your health care provider thinks it is needed. When it is time for the port to come out, surgery will be done to remove it.   done to remove it. The procedure is similar to the one performed when the port was put in. When should I seek immediate medical care? When you have an implanted port, you should seek immediate medical care if:  You notice a bad smell coming from the incision site.  You have swelling, redness, or drainage at the incision site.  You have more swelling or pain at the port site or the surrounding area.  You have a fever that is not controlled with medicine.  This information is not intended to replace advice given to you by your health care provider. Make sure you discuss any questions you have with your health care provider. Document  Released: 04/14/2005 Document Revised: 09/20/2015 Document Reviewed: 12/20/2012 Elsevier Interactive Patient Education  2017 [place]. Docetaxel injection What is this medicine? DOCETAXEL (doe se TAX el) is a chemotherapy drug. It targets fast dividing cells, like cancer cells, and causes these cells to die. This medicine is used to treat many types of cancers like breast cancer, certain stomach cancers, head and neck cancer, lung cancer, and prostate cancer. This medicine may be used for other purposes; ask your health care provider or pharmacist if you have questions. COMMON BRAND NAME(S): Docefrez, Taxotere What should I tell my health care provider before I take this medicine? They need to know if you have any of these conditions: -infection (especially a virus infection such as chickenpox, cold sores, or herpes) -liver disease -low blood counts, like low white cell, platelet, or red cell counts -an unusual or allergic reaction to docetaxel, polysorbate 80, other chemotherapy agents, other medicines, foods, dyes, or preservatives -pregnant or trying to get pregnant -breast-feeding How should I use this medicine? This drug is given as an infusion into a vein. It is administered in a hospital or clinic by a specially trained health care professional. Talk to your pediatrician regarding the use of this medicine in children. Special care may be needed. Overdosage: If you think you have taken too much of this medicine contact a poison control center or emergency room at once. NOTE: This medicine is only for you. Do not share this medicine with others. What if I miss a dose? It is important not to miss your dose. Call your doctor or health care professional if you are unable to keep an appointment. What may interact with this medicine? -cyclosporine -erythromycin -ketoconazole -medicines to increase blood counts like filgrastim, pegfilgrastim, sargramostim -vaccines Talk to your doctor  or health care professional before taking any of these medicines: -acetaminophen -aspirin -ibuprofen -ketoprofen -naproxen This list may not describe all possible interactions. Give your health care provider a list of all the medicines, herbs, non-prescription drugs, or dietary supplements you use. Also tell them if you smoke, drink alcohol, or use illegal drugs. Some items may interact with your medicine. What should I watch for while using this medicine? Your condition will be monitored carefully while you are receiving this medicine. You will need important blood work done while you are taking this medicine. This drug may make you feel generally unwell. This is not uncommon, as chemotherapy can affect healthy cells as well as cancer cells. Report any side effects. Continue your course of treatment even though you feel ill unless your doctor tells you to stop. In some cases, you may be given additional medicines to help with side effects. Follow all directions for their use. Call your doctor or health care professional for advice if you get a fever, chills or sore  throat, or other symptoms of a cold or flu. Do not treat yourself. This drug decreases your body's ability to fight infections. Try to avoid being around people who are sick. This medicine may increase your risk to bruise or bleed. Call your doctor or health care professional if you notice any unusual bleeding. This medicine may contain alcohol in the product. You may get drowsy or dizzy. Do not drive, use machinery, or do anything that needs mental alertness until you know how this medicine affects you. Do not stand or sit up quickly, especially if you are an older patient. This reduces the risk of dizzy or fainting spells. Avoid alcoholic drinks. Do not become pregnant while taking this medicine. Women should inform their doctor if they wish to become pregnant or think they might be pregnant. There is a potential for serious side effects  to an unborn child. Talk to your health care professional or pharmacist for more information. Do not breast-feed an infant while taking this medicine. What side effects may I notice from receiving this medicine? Side effects that you should report to your doctor or health care professional as soon as possible: -allergic reactions like skin rash, itching or hives, swelling of the face, lips, or tongue -low blood counts - This drug may decrease the number of white blood cells, red blood cells and platelets. You may be at increased risk for infections and bleeding. -signs of infection - fever or chills, cough, sore throat, pain or difficulty passing urine -signs of decreased platelets or bleeding - bruising, pinpoint red spots on the skin, black, tarry stools, nosebleeds -signs of decreased red blood cells - unusually weak or tired, fainting spells, lightheadedness -breathing problems -fast or irregular heartbeat -low blood pressure -mouth sores -nausea and vomiting -pain, swelling, redness or irritation at the injection site -pain, tingling, numbness in the hands or feet -swelling of the ankle, feet, hands -weight gain Side effects that usually do not require medical attention (report to your doctor or health care professional if they continue or are bothersome): -bone pain -complete hair loss including hair on your head, underarms, pubic hair, eyebrows, and eyelashes -diarrhea -excessive tearing -changes in the color of fingernails -loosening of the fingernails -nausea -muscle pain -red flush to skin -sweating -weak or tired This list may not describe all possible side effects. Call your doctor for medical advice about side effects. You may report side effects to FDA at 1-800-FDA-1088. Where should I keep my medicine? This drug is given in a hospital or clinic and will not be stored at home. NOTE: This sheet is a summary. It may not cover all possible information. If you have questions  about this medicine, talk to your doctor, pharmacist, or health care provider.  2018 Elsevier/Gold Standard (2015-05-17 12:32:56) Trastuzumab injection for infusion What is this medicine? TRASTUZUMAB (tras TOO zoo mab) is a monoclonal antibody. It is used to treat breast cancer and stomach cancer. This medicine may be used for other purposes; ask your health care provider or pharmacist if you have questions. COMMON BRAND NAME(S): Herceptin What should I tell my health care provider before I take this medicine? They need to know if you have any of these conditions: -heart disease -heart failure -lung or breathing disease, like asthma -an unusual or allergic reaction to trastuzumab, benzyl alcohol, or other medications, foods, dyes, or preservatives -pregnant or trying to get pregnant -breast-feeding How should I use this medicine? This drug is given as an infusion into a vein.  It is administered in a hospital or clinic by a specially trained health care professional. Talk to your pediatrician regarding the use of this medicine in children. This medicine is not approved for use in children. Overdosage: If you think you have taken too much of this medicine contact a poison control center or emergency room at once. NOTE: This medicine is only for you. Do not share this medicine with others. What if I miss a dose? It is important not to miss a dose. Call your doctor or health care professional if you are unable to keep an appointment. What may interact with this medicine? This medicine may interact with the following medications: -certain types of chemotherapy, such as daunorubicin, doxorubicin, epirubicin, and idarubicin This list may not describe all possible interactions. Give your health care provider a list of all the medicines, herbs, non-prescription drugs, or dietary supplements you use. Also tell them if you smoke, drink alcohol, or use illegal drugs. Some items may interact with your  medicine. What should I watch for while using this medicine? Visit your doctor for checks on your progress. Report any side effects. Continue your course of treatment even though you feel ill unless your doctor tells you to stop. Call your doctor or health care professional for advice if you get a fever, chills or sore throat, or other symptoms of a cold or flu. Do not treat yourself. Try to avoid being around people who are sick. You may experience fever, chills and shaking during your first infusion. These effects are usually mild and can be treated with other medicines. Report any side effects during the infusion to your health care professional. Fever and chills usually do not happen with later infusions. Do not become pregnant while taking this medicine or for 7 months after stopping it. Women should inform their doctor if they wish to become pregnant or think they might be pregnant. Women of child-bearing potential will need to have a negative pregnancy test before starting this medicine. There is a potential for serious side effects to an unborn child. Talk to your health care professional or pharmacist for more information. Do not breast-feed an infant while taking this medicine or for 7 months after stopping it. Women must use effective birth control with this medicine. What side effects may I notice from receiving this medicine? Side effects that you should report to your doctor or health care professional as soon as possible: -allergic reactions like skin rash, itching or hives, swelling of the face, lips, or tongue -chest pain or palpitations -cough -dizziness -feeling faint or lightheaded, falls -fever -general ill feeling or flu-like symptoms -signs of worsening heart failure like breathing problems; swelling in your legs and feet -unusually weak or tired Side effects that usually do not require medical attention (report to your doctor or health care professional if they continue or  are bothersome): -bone pain -changes in taste -diarrhea -joint pain -nausea/vomiting -weight loss This list may not describe all possible side effects. Call your doctor for medical advice about side effects. You may report side effects to FDA at 1-800-FDA-1088. Where should I keep my medicine? This drug is given in a hospital or clinic and will not be stored at home. NOTE: This sheet is a summary. It may not cover all possible information. If you have questions about this medicine, talk to your doctor, pharmacist, or health care provider.  2018 Elsevier/Gold Standard (2016-04-08 14:37:52) Breast Cancer Tumor Analysis Why am I having this test? This test  is used to determine the likelihood that your breast cancer will return after the tumor has been removed. This test also helps to determine any future treatment. What kind of sample is taken? This test is done using a biopsy of the breast tumor tissue. How do I prepare for this test? Talk to your health care provider about any necessary preparation. He or she will give directions as needed. What are the reference ranges? Reference ranges are considered healthy ranges established after testing a large group of healthy people. Reference ranges may vary among different people, labs, and hospitals. It is your responsibility to obtain your test results. Ask the lab or department performing the test when and how you will get your results. What do the results mean? Normal values include:  DNA ploidy: Diploid is favorable.  S-phase fraction: Less than 5.5% is favorable.  HER 2 protein: ? IHC method: 0 to 1+. ? FISH method: Less than 2 copies per cell. ? OncoType Dx Method: Less than 10.7 units.  Cathepsin D: Less than 10% is favorable.  p53 protein: Less than 10% is favorable.  Ki67 protein: ? 10-20% is borderline. ? Less than 20% is favorable.  If your results are outside of favorable ranges, this means that tumor markers are  present in higher quantities. This may indicate a more aggressive cancer and a higher risk of recurrence. Talk with your health care provider to discuss your results, treatment options, and if necessary, the need for more tests. Talk with your health care provider if you have any questions about your results. Talk with your health care provider to discuss your results, treatment options, and if necessary, the need for more tests. Talk with your health care provider if you have any questions about your results. This information is not intended to replace advice given to you by your health care provider. Make sure you discuss any questions you have with your health care provider. Document Released: 05/06/2004 Document Revised: 12/18/2015 Document Reviewed: 09/01/2013 Elsevier Interactive Patient Education  [employer].

## 2017-10-13 NOTE — Progress Notes (Signed)
Pt in for follow up and test results.  Reports "not feeling well at all".

## 2017-10-13 NOTE — Patient Instructions (Signed)
Trastuzumab injection for infusion What is this medicine? TRASTUZUMAB (tras TOO zoo mab) is a monoclonal antibody. It is used to treat breast cancer and stomach cancer. This medicine may be used for other purposes; ask your health care provider or pharmacist if you have questions. COMMON BRAND NAME(S): Herceptin What should I tell my health care provider before I take this medicine? They need to know if you have any of these conditions: -heart disease -heart failure -lung or breathing disease, like asthma -an unusual or allergic reaction to trastuzumab, benzyl alcohol, or other medications, foods, dyes, or preservatives -pregnant or trying to get pregnant -breast-feeding How should I use this medicine? This drug is given as an infusion into a vein. It is administered in a hospital or clinic by a specially trained health care professional. Talk to your pediatrician regarding the use of this medicine in children. This medicine is not approved for use in children. Overdosage: If you think you have taken too much of this medicine contact a poison control center or emergency room at once. NOTE: This medicine is only for you. Do not share this medicine with others. What if I miss a dose? It is important not to miss a dose. Call your doctor or health care professional if you are unable to keep an appointment. What may interact with this medicine? This medicine may interact with the following medications: -certain types of chemotherapy, such as daunorubicin, doxorubicin, epirubicin, and idarubicin This list may not describe all possible interactions. Give your health care provider a list of all the medicines, herbs, non-prescription drugs, or dietary supplements you use. Also tell them if you smoke, drink alcohol, or use illegal drugs. Some items may interact with your medicine. What should I watch for while using this medicine? Visit your doctor for checks on your progress. Report any side effects.  Continue your course of treatment even though you feel ill unless your doctor tells you to stop. Call your doctor or health care professional for advice if you get a fever, chills or sore throat, or other symptoms of a cold or flu. Do not treat yourself. Try to avoid being around people who are sick. You may experience fever, chills and shaking during your first infusion. These effects are usually mild and can be treated with other medicines. Report any side effects during the infusion to your health care professional. Fever and chills usually do not happen with later infusions. Do not become pregnant while taking this medicine or for 7 months after stopping it. Women should inform their doctor if they wish to become pregnant or think they might be pregnant. Women of child-bearing potential will need to have a negative pregnancy test before starting this medicine. There is a potential for serious side effects to an unborn child. Talk to your health care professional or pharmacist for more information. Do not breast-feed an infant while taking this medicine or for 7 months after stopping it. Women must use effective birth control with this medicine. What side effects may I notice from receiving this medicine? Side effects that you should report to your doctor or health care professional as soon as possible: -allergic reactions like skin rash, itching or hives, swelling of the face, lips, or tongue -chest pain or palpitations -cough -dizziness -feeling faint or lightheaded, falls -fever -general ill feeling or flu-like symptoms -signs of worsening heart failure like breathing problems; swelling in your legs and feet -unusually weak or tired Side effects that usually do not require medical   attention (report to your doctor or health care professional if they continue or are bothersome): -bone pain -changes in taste -diarrhea -joint pain -nausea/vomiting -weight loss This list may not describe all  possible side effects. Call your doctor for medical advice about side effects. You may report side effects to FDA at 1-800-FDA-1088. Where should I keep my medicine? This drug is given in a hospital or clinic and will not be stored at home. NOTE: This sheet is a summary. It may not cover all possible information. If you have questions about this medicine, talk to your doctor, pharmacist, or health care provider.  2018 Elsevier/Gold Standard (2016-04-08 14:37:52) Docetaxel injection What is this medicine? DOCETAXEL (doe se TAX el) is a chemotherapy drug. It targets fast dividing cells, like cancer cells, and causes these cells to die. This medicine is used to treat many types of cancers like breast cancer, certain stomach cancers, head and neck cancer, lung cancer, and prostate cancer. This medicine may be used for other purposes; ask your health care provider or pharmacist if you have questions. COMMON BRAND NAME(S): Docefrez, Taxotere What should I tell my health care provider before I take this medicine? They need to know if you have any of these conditions: -infection (especially a virus infection such as chickenpox, cold sores, or herpes) -liver disease -low blood counts, like low white cell, platelet, or red cell counts -an unusual or allergic reaction to docetaxel, polysorbate 80, other chemotherapy agents, other medicines, foods, dyes, or preservatives -pregnant or trying to get pregnant -breast-feeding How should I use this medicine? This drug is given as an infusion into a vein. It is administered in a hospital or clinic by a specially trained health care professional. Talk to your pediatrician regarding the use of this medicine in children. Special care may be needed. Overdosage: If you think you have taken too much of this medicine contact a poison control center or emergency room at once. NOTE: This medicine is only for you. Do not share this medicine with others. What if I miss a  dose? It is important not to miss your dose. Call your doctor or health care professional if you are unable to keep an appointment. What may interact with this medicine? -cyclosporine -erythromycin -ketoconazole -medicines to increase blood counts like filgrastim, pegfilgrastim, sargramostim -vaccines Talk to your doctor or health care professional before taking any of these medicines: -acetaminophen -aspirin -ibuprofen -ketoprofen -naproxen This list may not describe all possible interactions. Give your health care provider a list of all the medicines, herbs, non-prescription drugs, or dietary supplements you use. Also tell them if you smoke, drink alcohol, or use illegal drugs. Some items may interact with your medicine. What should I watch for while using this medicine? Your condition will be monitored carefully while you are receiving this medicine. You will need important blood work done while you are taking this medicine. This drug may make you feel generally unwell. This is not uncommon, as chemotherapy can affect healthy cells as well as cancer cells. Report any side effects. Continue your course of treatment even though you feel ill unless your doctor tells you to stop. In some cases, you may be given additional medicines to help with side effects. Follow all directions for their use. Call your doctor or health care professional for advice if you get a fever, chills or sore throat, or other symptoms of a cold or flu. Do not treat yourself. This drug decreases your body's ability to fight infections. Try  to avoid being around people who are sick. This medicine may increase your risk to bruise or bleed. Call your doctor or health care professional if you notice any unusual bleeding. This medicine may contain alcohol in the product. You may get drowsy or dizzy. Do not drive, use machinery, or do anything that needs mental alertness until you know how this medicine affects you. Do not stand  or sit up quickly, especially if you are an older patient. This reduces the risk of dizzy or fainting spells. Avoid alcoholic drinks. Do not become pregnant while taking this medicine. Women should inform their doctor if they wish to become pregnant or think they might be pregnant. There is a potential for serious side effects to an unborn child. Talk to your health care professional or pharmacist for more information. Do not breast-feed an infant while taking this medicine. What side effects may I notice from receiving this medicine? Side effects that you should report to your doctor or health care professional as soon as possible: -allergic reactions like skin rash, itching or hives, swelling of the face, lips, or tongue -low blood counts - This drug may decrease the number of white blood cells, red blood cells and platelets. You may be at increased risk for infections and bleeding. -signs of infection - fever or chills, cough, sore throat, pain or difficulty passing urine -signs of decreased platelets or bleeding - bruising, pinpoint red spots on the skin, black, tarry stools, nosebleeds -signs of decreased red blood cells - unusually weak or tired, fainting spells, lightheadedness -breathing problems -fast or irregular heartbeat -low blood pressure -mouth sores -nausea and vomiting -pain, swelling, redness or irritation at the injection site -pain, tingling, numbness in the hands or feet -swelling of the ankle, feet, hands -weight gain Side effects that usually do not require medical attention (report to your doctor or health care professional if they continue or are bothersome): -bone pain -complete hair loss including hair on your head, underarms, pubic hair, eyebrows, and eyelashes -diarrhea -excessive tearing -changes in the color of fingernails -loosening of the fingernails -nausea -muscle pain -red flush to skin -sweating -weak or tired This list may not describe all possible  side effects. Call your doctor for medical advice about side effects. You may report side effects to FDA at 1-800-FDA-1088. Where should I keep my medicine? This drug is given in a hospital or clinic and will not be stored at home. NOTE: This sheet is a summary. It may not cover all possible information. If you have questions about this medicine, talk to your doctor, pharmacist, or health care provider.  2018 Elsevier/Gold Standard (2015-05-17 12:32:56)

## 2017-10-14 ENCOUNTER — Inpatient Hospital Stay: Payer: Medicare Other

## 2017-10-14 ENCOUNTER — Telehealth: Payer: Self-pay

## 2017-10-14 DIAGNOSIS — R180 Malignant ascites: Secondary | ICD-10-CM | POA: Diagnosis not present

## 2017-10-14 NOTE — Telephone Encounter (Signed)
Spoke with patient's POA, [name], about setting her up for a port placement at [facility]. The patient has recently moved into [facility] and does not have a phone at this time. She is amendable to setting up the port placement. The patient is scheduled for surgery at [facility] on 10/21/17. She will pre admit by phone. The patient is aware of date and instructions.

## 2017-10-15 ENCOUNTER — Other Ambulatory Visit: Payer: Medicare Other

## 2017-10-16 ENCOUNTER — Telehealth: Payer: Self-pay | Admitting: *Deleted

## 2017-10-16 NOTE — Telephone Encounter (Signed)
Patient has decided not to take any treatments and wants to cancel all appointments. She has decided that she wants to be Hospice and live out the remainder of her days with comfort care, per [facility]. Appointments canceled per her request.          dhs

## 2017-10-19 ENCOUNTER — Encounter: Payer: Self-pay | Admitting: Hematology and Oncology

## 2017-10-19 ENCOUNTER — Telehealth: Payer: Self-pay

## 2017-10-19 LAB — SURGICAL PATHOLOGY

## 2017-10-19 NOTE — Telephone Encounter (Signed)
Spoke with [name], the patient's POA and she says her and the patient had a long talk and the patient has decided not to go forward with treatment. I let her know that we would cancel her surgery for 10/21/17. She is aware to call us with any questions or if we can help with anything.

## 2017-10-20 ENCOUNTER — Inpatient Hospital Stay: Admission: RE | Admit: 2017-10-20 | Payer: Medicare Other | Source: Ambulatory Visit

## 2017-10-20 ENCOUNTER — Other Ambulatory Visit: Payer: Self-pay

## 2017-10-20 DIAGNOSIS — R627 Adult failure to thrive: Secondary | ICD-10-CM | POA: Diagnosis not present

## 2017-10-20 DIAGNOSIS — M6281 Muscle weakness (generalized): Secondary | ICD-10-CM | POA: Diagnosis not present

## 2017-10-20 DIAGNOSIS — I9589 Other hypotension: Secondary | ICD-10-CM | POA: Diagnosis not present

## 2017-10-20 DIAGNOSIS — R000 Tachycardia, unspecified: Secondary | ICD-10-CM | POA: Diagnosis not present

## 2017-10-20 DIAGNOSIS — F419 Anxiety disorder, unspecified: Secondary | ICD-10-CM | POA: Diagnosis not present

## 2017-10-20 DIAGNOSIS — Z853 Personal history of malignant neoplasm of breast: Secondary | ICD-10-CM | POA: Diagnosis not present

## 2017-10-20 DIAGNOSIS — G809 Cerebral palsy, unspecified: Secondary | ICD-10-CM | POA: Diagnosis not present

## 2017-10-20 DIAGNOSIS — D490 Neoplasm of unspecified behavior of digestive system: Secondary | ICD-10-CM | POA: Diagnosis not present

## 2017-10-20 DIAGNOSIS — R180 Malignant ascites: Secondary | ICD-10-CM | POA: Diagnosis not present

## 2017-10-20 DIAGNOSIS — Z7189 Other specified counseling: Secondary | ICD-10-CM | POA: Diagnosis not present

## 2017-10-20 DIAGNOSIS — C229 Malignant neoplasm of liver, not specified as primary or secondary: Secondary | ICD-10-CM | POA: Diagnosis not present

## 2017-10-21 ENCOUNTER — Encounter: Admission: RE | Payer: Self-pay | Source: Ambulatory Visit

## 2017-10-21 ENCOUNTER — Ambulatory Visit: Admission: RE | Admit: 2017-10-21 | Payer: Medicare Other | Source: Ambulatory Visit | Admitting: General Surgery

## 2017-10-21 SURGERY — INSERTION, TUNNELED CENTRAL VENOUS DEVICE, WITH PORT
Anesthesia: Choice

## 2017-10-22 ENCOUNTER — Ambulatory Visit: Payer: Medicare Other

## 2017-10-22 ENCOUNTER — Other Ambulatory Visit: Payer: Medicare Other

## 2017-10-22 ENCOUNTER — Ambulatory Visit: Payer: Medicare Other | Admitting: Hematology and Oncology

## 2017-10-22 DIAGNOSIS — C229 Malignant neoplasm of liver, not specified as primary or secondary: Secondary | ICD-10-CM | POA: Diagnosis not present

## 2017-10-22 DIAGNOSIS — Z853 Personal history of malignant neoplasm of breast: Secondary | ICD-10-CM | POA: Diagnosis not present

## 2017-10-22 DIAGNOSIS — D490 Neoplasm of unspecified behavior of digestive system: Secondary | ICD-10-CM | POA: Diagnosis not present

## 2017-10-22 DIAGNOSIS — R180 Malignant ascites: Secondary | ICD-10-CM | POA: Diagnosis not present

## 2017-10-22 DIAGNOSIS — R627 Adult failure to thrive: Secondary | ICD-10-CM | POA: Diagnosis not present

## 2017-10-22 DIAGNOSIS — F419 Anxiety disorder, unspecified: Secondary | ICD-10-CM | POA: Diagnosis not present

## 2017-10-23 ENCOUNTER — Ambulatory Visit: Payer: Medicare Other

## 2017-10-23 ENCOUNTER — Other Ambulatory Visit: Payer: Medicare Other

## 2017-10-23 ENCOUNTER — Ambulatory Visit: Payer: Medicare Other | Admitting: Hematology and Oncology

## 2017-10-23 DIAGNOSIS — F419 Anxiety disorder, unspecified: Secondary | ICD-10-CM | POA: Diagnosis not present

## 2017-10-23 DIAGNOSIS — C229 Malignant neoplasm of liver, not specified as primary or secondary: Secondary | ICD-10-CM | POA: Diagnosis not present

## 2017-10-23 DIAGNOSIS — Z853 Personal history of malignant neoplasm of breast: Secondary | ICD-10-CM | POA: Diagnosis not present

## 2017-10-23 DIAGNOSIS — D490 Neoplasm of unspecified behavior of digestive system: Secondary | ICD-10-CM | POA: Diagnosis not present

## 2017-10-23 DIAGNOSIS — R180 Malignant ascites: Secondary | ICD-10-CM | POA: Diagnosis not present

## 2017-10-23 DIAGNOSIS — R627 Adult failure to thrive: Secondary | ICD-10-CM | POA: Diagnosis not present

## 2017-10-26 DIAGNOSIS — D490 Neoplasm of unspecified behavior of digestive system: Secondary | ICD-10-CM | POA: Diagnosis not present

## 2017-10-26 DIAGNOSIS — F419 Anxiety disorder, unspecified: Secondary | ICD-10-CM | POA: Diagnosis not present

## 2017-10-26 DIAGNOSIS — M6281 Muscle weakness (generalized): Secondary | ICD-10-CM | POA: Diagnosis not present

## 2017-10-26 DIAGNOSIS — R180 Malignant ascites: Secondary | ICD-10-CM | POA: Diagnosis not present

## 2017-10-26 DIAGNOSIS — R000 Tachycardia, unspecified: Secondary | ICD-10-CM | POA: Diagnosis not present

## 2017-10-26 DIAGNOSIS — Z7189 Other specified counseling: Secondary | ICD-10-CM | POA: Diagnosis not present

## 2017-10-26 DIAGNOSIS — R627 Adult failure to thrive: Secondary | ICD-10-CM | POA: Diagnosis not present

## 2017-10-26 DIAGNOSIS — Z853 Personal history of malignant neoplasm of breast: Secondary | ICD-10-CM | POA: Diagnosis not present

## 2017-10-26 DIAGNOSIS — G809 Cerebral palsy, unspecified: Secondary | ICD-10-CM | POA: Diagnosis not present

## 2017-10-26 DIAGNOSIS — C229 Malignant neoplasm of liver, not specified as primary or secondary: Secondary | ICD-10-CM | POA: Diagnosis not present

## 2017-10-26 DIAGNOSIS — I9589 Other hypotension: Secondary | ICD-10-CM | POA: Diagnosis not present

## 2017-10-27 DIAGNOSIS — C229 Malignant neoplasm of liver, not specified as primary or secondary: Secondary | ICD-10-CM | POA: Diagnosis not present

## 2017-10-27 DIAGNOSIS — D490 Neoplasm of unspecified behavior of digestive system: Secondary | ICD-10-CM | POA: Diagnosis not present

## 2017-10-27 DIAGNOSIS — R180 Malignant ascites: Secondary | ICD-10-CM | POA: Diagnosis not present

## 2017-10-27 DIAGNOSIS — F419 Anxiety disorder, unspecified: Secondary | ICD-10-CM | POA: Diagnosis not present

## 2017-10-27 DIAGNOSIS — R627 Adult failure to thrive: Secondary | ICD-10-CM | POA: Diagnosis not present

## 2017-10-27 DIAGNOSIS — Z853 Personal history of malignant neoplasm of breast: Secondary | ICD-10-CM | POA: Diagnosis not present

## 2017-10-28 DIAGNOSIS — R627 Adult failure to thrive: Secondary | ICD-10-CM | POA: Diagnosis not present

## 2017-10-28 DIAGNOSIS — D490 Neoplasm of unspecified behavior of digestive system: Secondary | ICD-10-CM | POA: Diagnosis not present

## 2017-10-28 DIAGNOSIS — Z853 Personal history of malignant neoplasm of breast: Secondary | ICD-10-CM | POA: Diagnosis not present

## 2017-10-28 DIAGNOSIS — R180 Malignant ascites: Secondary | ICD-10-CM | POA: Diagnosis not present

## 2017-10-28 DIAGNOSIS — C229 Malignant neoplasm of liver, not specified as primary or secondary: Secondary | ICD-10-CM | POA: Diagnosis not present

## 2017-10-28 DIAGNOSIS — F419 Anxiety disorder, unspecified: Secondary | ICD-10-CM | POA: Diagnosis not present

## 2017-10-29 DIAGNOSIS — R180 Malignant ascites: Secondary | ICD-10-CM | POA: Diagnosis not present

## 2017-10-29 DIAGNOSIS — F419 Anxiety disorder, unspecified: Secondary | ICD-10-CM | POA: Diagnosis not present

## 2017-10-29 DIAGNOSIS — D490 Neoplasm of unspecified behavior of digestive system: Secondary | ICD-10-CM | POA: Diagnosis not present

## 2017-10-29 DIAGNOSIS — R627 Adult failure to thrive: Secondary | ICD-10-CM | POA: Diagnosis not present

## 2017-10-29 DIAGNOSIS — C229 Malignant neoplasm of liver, not specified as primary or secondary: Secondary | ICD-10-CM | POA: Diagnosis not present

## 2017-10-29 DIAGNOSIS — Z853 Personal history of malignant neoplasm of breast: Secondary | ICD-10-CM | POA: Diagnosis not present

## 2017-10-30 DIAGNOSIS — C229 Malignant neoplasm of liver, not specified as primary or secondary: Secondary | ICD-10-CM | POA: Diagnosis not present

## 2017-10-30 DIAGNOSIS — Z853 Personal history of malignant neoplasm of breast: Secondary | ICD-10-CM | POA: Diagnosis not present

## 2017-10-30 DIAGNOSIS — R180 Malignant ascites: Secondary | ICD-10-CM | POA: Diagnosis not present

## 2017-10-30 DIAGNOSIS — D490 Neoplasm of unspecified behavior of digestive system: Secondary | ICD-10-CM | POA: Diagnosis not present

## 2017-10-30 DIAGNOSIS — F419 Anxiety disorder, unspecified: Secondary | ICD-10-CM | POA: Diagnosis not present

## 2017-10-30 DIAGNOSIS — R627 Adult failure to thrive: Secondary | ICD-10-CM | POA: Diagnosis not present

## 2017-11-02 DIAGNOSIS — D490 Neoplasm of unspecified behavior of digestive system: Secondary | ICD-10-CM | POA: Diagnosis not present

## 2017-11-02 DIAGNOSIS — R180 Malignant ascites: Secondary | ICD-10-CM | POA: Diagnosis not present

## 2017-11-02 DIAGNOSIS — F419 Anxiety disorder, unspecified: Secondary | ICD-10-CM | POA: Diagnosis not present

## 2017-11-02 DIAGNOSIS — C229 Malignant neoplasm of liver, not specified as primary or secondary: Secondary | ICD-10-CM | POA: Diagnosis not present

## 2017-11-02 DIAGNOSIS — R627 Adult failure to thrive: Secondary | ICD-10-CM | POA: Diagnosis not present

## 2017-11-02 DIAGNOSIS — Z853 Personal history of malignant neoplasm of breast: Secondary | ICD-10-CM | POA: Diagnosis not present

## 2017-11-03 DIAGNOSIS — F419 Anxiety disorder, unspecified: Secondary | ICD-10-CM | POA: Diagnosis not present

## 2017-11-03 DIAGNOSIS — D490 Neoplasm of unspecified behavior of digestive system: Secondary | ICD-10-CM | POA: Diagnosis not present

## 2017-11-03 DIAGNOSIS — R180 Malignant ascites: Secondary | ICD-10-CM | POA: Diagnosis not present

## 2017-11-03 DIAGNOSIS — C229 Malignant neoplasm of liver, not specified as primary or secondary: Secondary | ICD-10-CM | POA: Diagnosis not present

## 2017-11-03 DIAGNOSIS — R627 Adult failure to thrive: Secondary | ICD-10-CM | POA: Diagnosis not present

## 2017-11-03 DIAGNOSIS — Z853 Personal history of malignant neoplasm of breast: Secondary | ICD-10-CM | POA: Diagnosis not present

## 2017-11-04 ENCOUNTER — Other Ambulatory Visit: Payer: Self-pay | Admitting: Nurse Practitioner

## 2017-11-04 DIAGNOSIS — Z853 Personal history of malignant neoplasm of breast: Secondary | ICD-10-CM | POA: Diagnosis not present

## 2017-11-04 DIAGNOSIS — R180 Malignant ascites: Secondary | ICD-10-CM | POA: Diagnosis not present

## 2017-11-04 DIAGNOSIS — D490 Neoplasm of unspecified behavior of digestive system: Secondary | ICD-10-CM | POA: Diagnosis not present

## 2017-11-04 DIAGNOSIS — C229 Malignant neoplasm of liver, not specified as primary or secondary: Secondary | ICD-10-CM | POA: Diagnosis not present

## 2017-11-04 DIAGNOSIS — R627 Adult failure to thrive: Secondary | ICD-10-CM | POA: Diagnosis not present

## 2017-11-04 DIAGNOSIS — F419 Anxiety disorder, unspecified: Secondary | ICD-10-CM | POA: Diagnosis not present

## 2017-11-05 ENCOUNTER — Ambulatory Visit
Admission: RE | Admit: 2017-11-05 | Discharge: 2017-11-05 | Disposition: A | Discharge status: Home / Self Care | Source: Ambulatory Visit | Attending: Nurse Practitioner | Admitting: Nurse Practitioner

## 2017-11-05 ENCOUNTER — Ambulatory Visit: Admission: RE | Admit: 2017-11-05 | Payer: Medicare Other | Source: Ambulatory Visit

## 2017-11-05 ENCOUNTER — Ambulatory Visit

## 2017-11-05 DIAGNOSIS — D490 Neoplasm of unspecified behavior of digestive system: Secondary | ICD-10-CM | POA: Diagnosis not present

## 2017-11-05 DIAGNOSIS — R180 Malignant ascites: Secondary | ICD-10-CM | POA: Insufficient documentation

## 2017-11-05 DIAGNOSIS — C229 Malignant neoplasm of liver, not specified as primary or secondary: Secondary | ICD-10-CM | POA: Diagnosis not present

## 2017-11-05 DIAGNOSIS — I959 Hypotension, unspecified: Secondary | ICD-10-CM | POA: Diagnosis not present

## 2017-11-05 DIAGNOSIS — F411 Generalized anxiety disorder: Secondary | ICD-10-CM | POA: Diagnosis not present

## 2017-11-05 DIAGNOSIS — R52 Pain, unspecified: Secondary | ICD-10-CM | POA: Diagnosis not present

## 2017-11-05 DIAGNOSIS — R627 Adult failure to thrive: Secondary | ICD-10-CM | POA: Diagnosis not present

## 2017-11-05 DIAGNOSIS — C801 Malignant (primary) neoplasm, unspecified: Secondary | ICD-10-CM | POA: Diagnosis not present

## 2017-11-05 DIAGNOSIS — F419 Anxiety disorder, unspecified: Secondary | ICD-10-CM | POA: Diagnosis not present

## 2017-11-05 DIAGNOSIS — Z853 Personal history of malignant neoplasm of breast: Secondary | ICD-10-CM | POA: Diagnosis not present

## 2017-11-06 DIAGNOSIS — R180 Malignant ascites: Secondary | ICD-10-CM | POA: Diagnosis not present

## 2017-11-06 DIAGNOSIS — C229 Malignant neoplasm of liver, not specified as primary or secondary: Secondary | ICD-10-CM | POA: Diagnosis not present

## 2017-11-06 DIAGNOSIS — Z853 Personal history of malignant neoplasm of breast: Secondary | ICD-10-CM | POA: Diagnosis not present

## 2017-11-06 DIAGNOSIS — F419 Anxiety disorder, unspecified: Secondary | ICD-10-CM | POA: Diagnosis not present

## 2017-11-06 DIAGNOSIS — D490 Neoplasm of unspecified behavior of digestive system: Secondary | ICD-10-CM | POA: Diagnosis not present

## 2017-11-06 DIAGNOSIS — R627 Adult failure to thrive: Secondary | ICD-10-CM | POA: Diagnosis not present

## 2017-11-07 DIAGNOSIS — C229 Malignant neoplasm of liver, not specified as primary or secondary: Secondary | ICD-10-CM | POA: Diagnosis not present

## 2017-11-07 DIAGNOSIS — D490 Neoplasm of unspecified behavior of digestive system: Secondary | ICD-10-CM | POA: Diagnosis not present

## 2017-11-07 DIAGNOSIS — R627 Adult failure to thrive: Secondary | ICD-10-CM | POA: Diagnosis not present

## 2017-11-07 DIAGNOSIS — F419 Anxiety disorder, unspecified: Secondary | ICD-10-CM | POA: Diagnosis not present

## 2017-11-07 DIAGNOSIS — R180 Malignant ascites: Secondary | ICD-10-CM | POA: Diagnosis not present

## 2017-11-07 DIAGNOSIS — Z853 Personal history of malignant neoplasm of breast: Secondary | ICD-10-CM | POA: Diagnosis not present

## 2017-11-08 DIAGNOSIS — G809 Cerebral palsy, unspecified: Secondary | ICD-10-CM | POA: Diagnosis not present

## 2017-11-08 DIAGNOSIS — M6281 Muscle weakness (generalized): Secondary | ICD-10-CM | POA: Diagnosis not present

## 2017-11-08 DIAGNOSIS — Z7189 Other specified counseling: Secondary | ICD-10-CM | POA: Diagnosis not present

## 2017-11-08 DIAGNOSIS — C229 Malignant neoplasm of liver, not specified as primary or secondary: Secondary | ICD-10-CM | POA: Diagnosis not present

## 2017-11-08 DIAGNOSIS — R180 Malignant ascites: Secondary | ICD-10-CM | POA: Diagnosis not present

## 2017-11-08 DIAGNOSIS — R627 Adult failure to thrive: Secondary | ICD-10-CM | POA: Diagnosis not present

## 2017-11-08 DIAGNOSIS — Z853 Personal history of malignant neoplasm of breast: Secondary | ICD-10-CM | POA: Diagnosis not present

## 2017-11-08 DIAGNOSIS — D490 Neoplasm of unspecified behavior of digestive system: Secondary | ICD-10-CM | POA: Diagnosis not present

## 2017-11-08 DIAGNOSIS — I9589 Other hypotension: Secondary | ICD-10-CM | POA: Diagnosis not present

## 2017-11-08 DIAGNOSIS — F419 Anxiety disorder, unspecified: Secondary | ICD-10-CM | POA: Diagnosis not present

## 2017-11-08 DIAGNOSIS — R000 Tachycardia, unspecified: Secondary | ICD-10-CM | POA: Diagnosis not present

## 2017-11-09 DIAGNOSIS — F419 Anxiety disorder, unspecified: Secondary | ICD-10-CM | POA: Diagnosis not present

## 2017-11-09 DIAGNOSIS — R180 Malignant ascites: Secondary | ICD-10-CM | POA: Diagnosis not present

## 2017-11-09 DIAGNOSIS — C229 Malignant neoplasm of liver, not specified as primary or secondary: Secondary | ICD-10-CM | POA: Diagnosis not present

## 2017-11-09 DIAGNOSIS — Z853 Personal history of malignant neoplasm of breast: Secondary | ICD-10-CM | POA: Diagnosis not present

## 2017-11-09 DIAGNOSIS — D490 Neoplasm of unspecified behavior of digestive system: Secondary | ICD-10-CM | POA: Diagnosis not present

## 2017-11-09 DIAGNOSIS — R627 Adult failure to thrive: Secondary | ICD-10-CM | POA: Diagnosis not present

## 2017-11-10 DIAGNOSIS — D490 Neoplasm of unspecified behavior of digestive system: Secondary | ICD-10-CM | POA: Diagnosis not present

## 2017-11-10 DIAGNOSIS — F419 Anxiety disorder, unspecified: Secondary | ICD-10-CM | POA: Diagnosis not present

## 2017-11-10 DIAGNOSIS — R627 Adult failure to thrive: Secondary | ICD-10-CM | POA: Diagnosis not present

## 2017-11-10 DIAGNOSIS — C229 Malignant neoplasm of liver, not specified as primary or secondary: Secondary | ICD-10-CM | POA: Diagnosis not present

## 2017-11-10 DIAGNOSIS — R180 Malignant ascites: Secondary | ICD-10-CM | POA: Diagnosis not present

## 2017-11-10 DIAGNOSIS — Z853 Personal history of malignant neoplasm of breast: Secondary | ICD-10-CM | POA: Diagnosis not present

## 2017-11-11 DIAGNOSIS — R627 Adult failure to thrive: Secondary | ICD-10-CM | POA: Diagnosis not present

## 2017-11-11 DIAGNOSIS — C229 Malignant neoplasm of liver, not specified as primary or secondary: Secondary | ICD-10-CM | POA: Diagnosis not present

## 2017-11-11 DIAGNOSIS — F419 Anxiety disorder, unspecified: Secondary | ICD-10-CM | POA: Diagnosis not present

## 2017-11-11 DIAGNOSIS — Z853 Personal history of malignant neoplasm of breast: Secondary | ICD-10-CM | POA: Diagnosis not present

## 2017-11-11 DIAGNOSIS — R180 Malignant ascites: Secondary | ICD-10-CM | POA: Diagnosis not present

## 2017-11-11 DIAGNOSIS — D490 Neoplasm of unspecified behavior of digestive system: Secondary | ICD-10-CM | POA: Diagnosis not present

## 2017-11-12 DIAGNOSIS — R627 Adult failure to thrive: Secondary | ICD-10-CM | POA: Diagnosis not present

## 2017-11-12 DIAGNOSIS — R180 Malignant ascites: Secondary | ICD-10-CM | POA: Diagnosis not present

## 2017-11-12 DIAGNOSIS — Z853 Personal history of malignant neoplasm of breast: Secondary | ICD-10-CM | POA: Diagnosis not present

## 2017-11-12 DIAGNOSIS — C229 Malignant neoplasm of liver, not specified as primary or secondary: Secondary | ICD-10-CM | POA: Diagnosis not present

## 2017-11-12 DIAGNOSIS — D490 Neoplasm of unspecified behavior of digestive system: Secondary | ICD-10-CM | POA: Diagnosis not present

## 2017-11-12 DIAGNOSIS — F419 Anxiety disorder, unspecified: Secondary | ICD-10-CM | POA: Diagnosis not present

## 2017-11-13 DIAGNOSIS — Z853 Personal history of malignant neoplasm of breast: Secondary | ICD-10-CM | POA: Diagnosis not present

## 2017-11-13 DIAGNOSIS — D490 Neoplasm of unspecified behavior of digestive system: Secondary | ICD-10-CM | POA: Diagnosis not present

## 2017-11-13 DIAGNOSIS — R180 Malignant ascites: Secondary | ICD-10-CM | POA: Diagnosis not present

## 2017-11-13 DIAGNOSIS — C229 Malignant neoplasm of liver, not specified as primary or secondary: Secondary | ICD-10-CM | POA: Diagnosis not present

## 2017-11-13 DIAGNOSIS — R627 Adult failure to thrive: Secondary | ICD-10-CM | POA: Diagnosis not present

## 2017-11-13 DIAGNOSIS — F419 Anxiety disorder, unspecified: Secondary | ICD-10-CM | POA: Diagnosis not present

## 2017-11-15 DIAGNOSIS — R180 Malignant ascites: Secondary | ICD-10-CM | POA: Diagnosis not present

## 2017-11-15 DIAGNOSIS — C229 Malignant neoplasm of liver, not specified as primary or secondary: Secondary | ICD-10-CM | POA: Diagnosis not present

## 2017-11-15 DIAGNOSIS — D490 Neoplasm of unspecified behavior of digestive system: Secondary | ICD-10-CM | POA: Diagnosis not present

## 2017-11-15 DIAGNOSIS — R627 Adult failure to thrive: Secondary | ICD-10-CM | POA: Diagnosis not present

## 2017-11-15 DIAGNOSIS — F419 Anxiety disorder, unspecified: Secondary | ICD-10-CM | POA: Diagnosis not present

## 2017-11-15 DIAGNOSIS — Z853 Personal history of malignant neoplasm of breast: Secondary | ICD-10-CM | POA: Diagnosis not present

## 2017-11-16 DIAGNOSIS — D490 Neoplasm of unspecified behavior of digestive system: Secondary | ICD-10-CM | POA: Diagnosis not present

## 2017-11-16 DIAGNOSIS — F419 Anxiety disorder, unspecified: Secondary | ICD-10-CM | POA: Diagnosis not present

## 2017-11-16 DIAGNOSIS — R180 Malignant ascites: Secondary | ICD-10-CM | POA: Diagnosis not present

## 2017-11-16 DIAGNOSIS — R627 Adult failure to thrive: Secondary | ICD-10-CM | POA: Diagnosis not present

## 2017-11-16 DIAGNOSIS — C229 Malignant neoplasm of liver, not specified as primary or secondary: Secondary | ICD-10-CM | POA: Diagnosis not present

## 2017-11-16 DIAGNOSIS — Z853 Personal history of malignant neoplasm of breast: Secondary | ICD-10-CM | POA: Diagnosis not present

## 2017-11-26 DEATH — deceased

## 2018-10-09 IMAGING — CT CT ABD-PELV W/ CM
2 of 6 series · 13 of 46 positions shown, 15 images · IV contrast (APPLIED)
Comparison: MRI 07/09/2017

CLINICAL DATA: Abdominal distension. Palsy. Indeterminate lesions
within liver identified on MRI 07/09/2017.

EXAM:
CT ABDOMEN AND PELVIS WITH CONTRAST
TECHNIQUE: Multidetector CT imaging of the abdomen and pelvis was performed
using the standard protocol following bolus administration of
intravenous contrast.
CONTRAST:  75mL OMNIPAQUE IOHEXOL 300 MG/ML  SOLN

[Series 2: routine abd/pel with · axial · 0.74mm/px · z∈[-516,-106]mm · 10 of 102 slices shown, 12 images]
[im 10/102  soft-tissue]
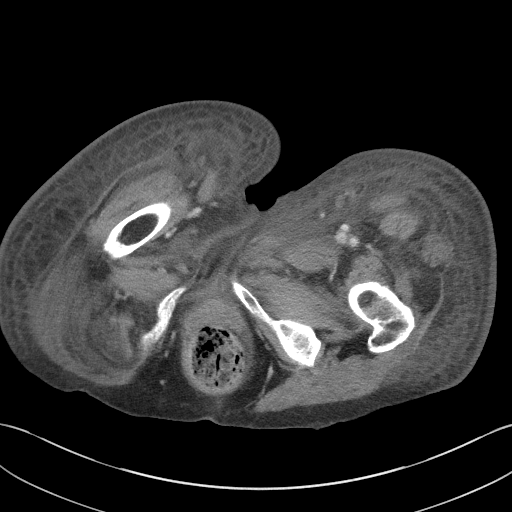
[im 10/102  bone]
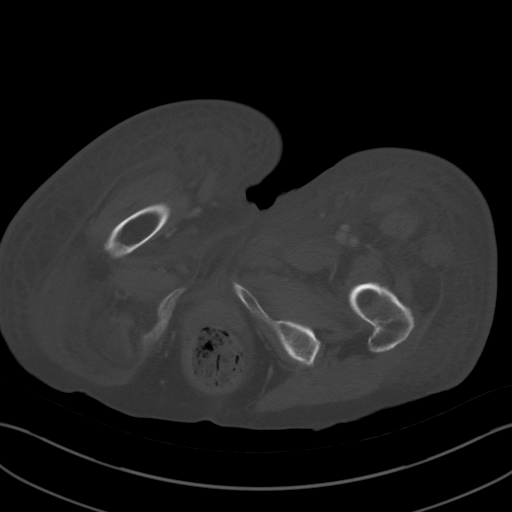
[im 19/102  soft-tissue]
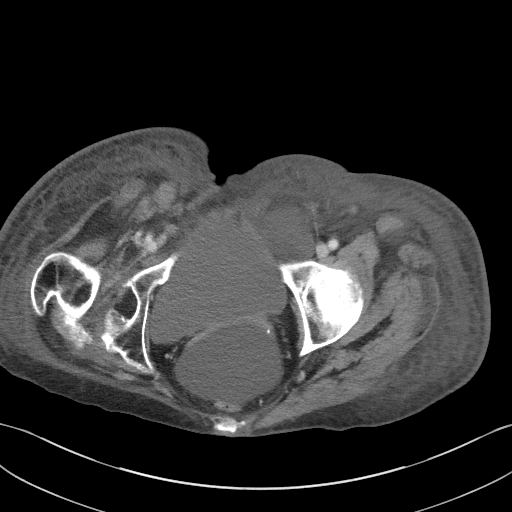
[im 28/102  soft-tissue]
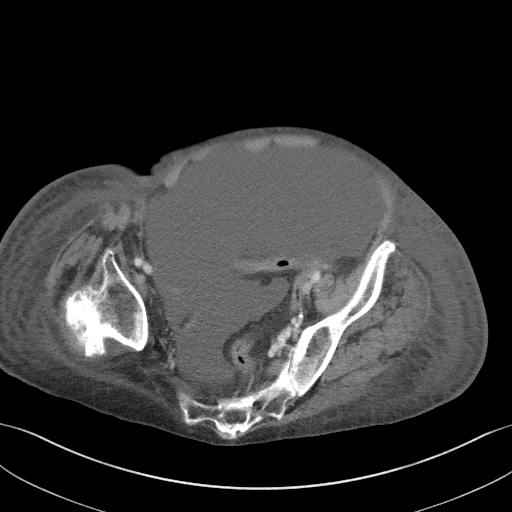
[im 37/102  soft-tissue]
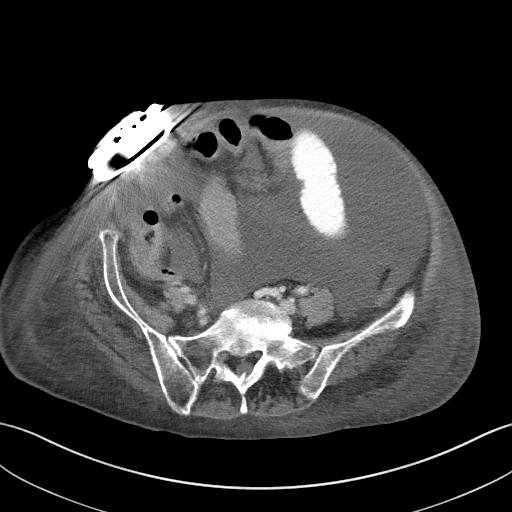
[im 46/102  soft-tissue]
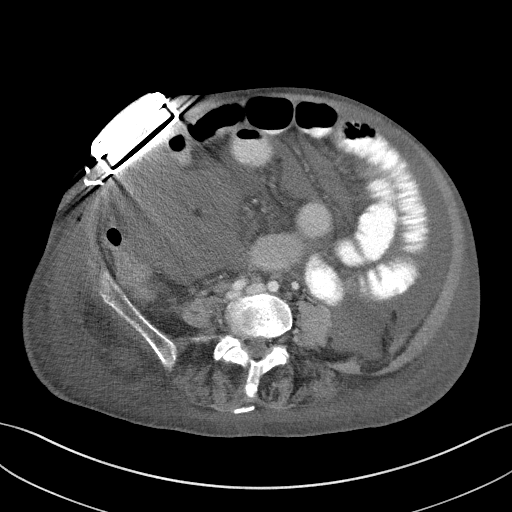
[im 56/102  soft-tissue]
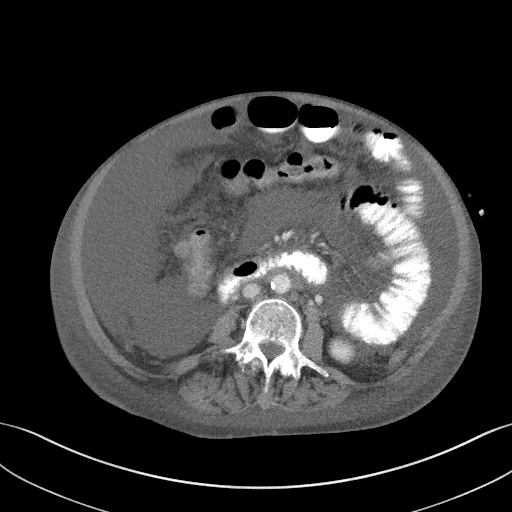
[im 65/102  soft-tissue]
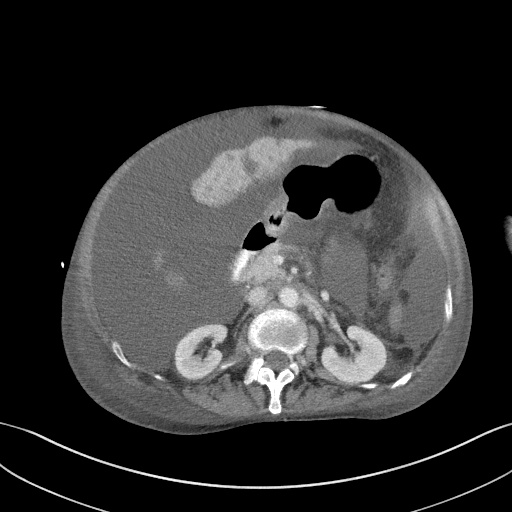
[im 74/102  soft-tissue]
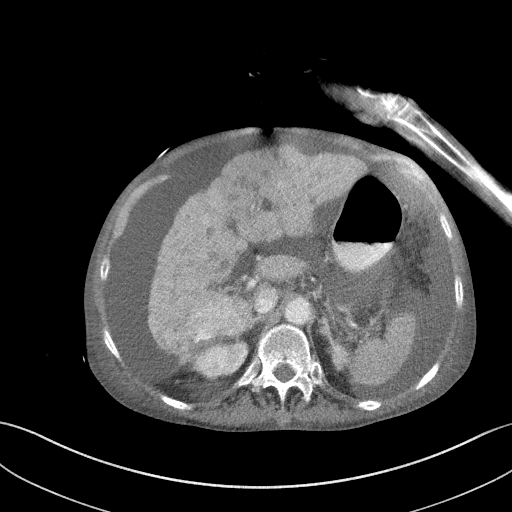
[im 83/102  soft-tissue]
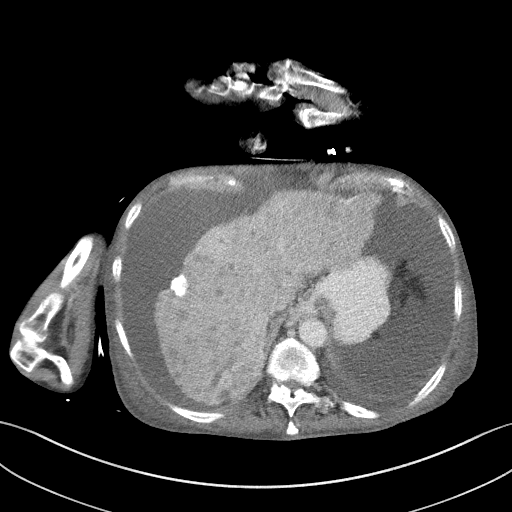
[im 83/102  bone]
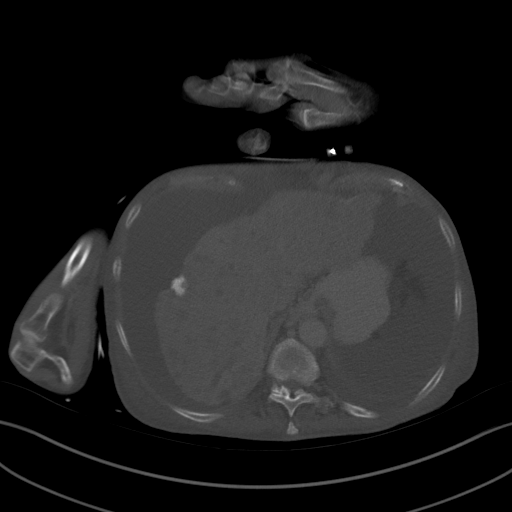
[im 92/102  soft-tissue]
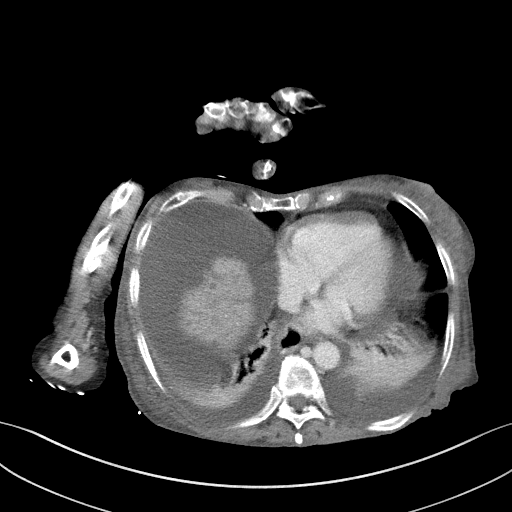

[Series 5: coronal st · coronal · 0.77mm/px · 3 of 85 slices shown]
[im 29/85  soft-tissue]
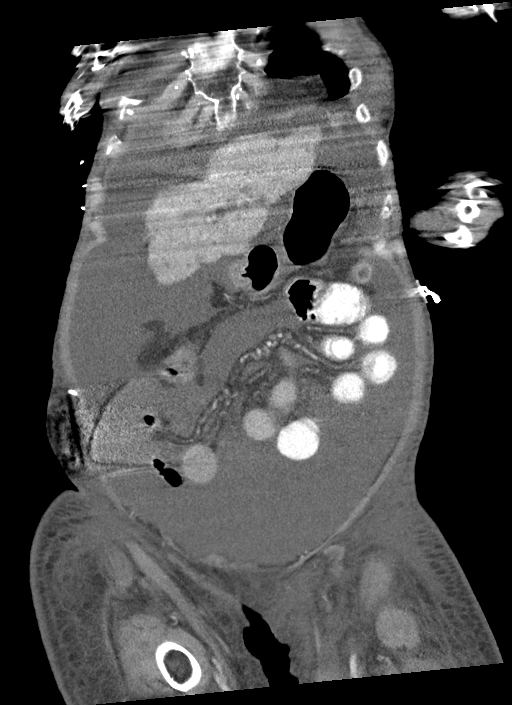
[im 38/85  soft-tissue]
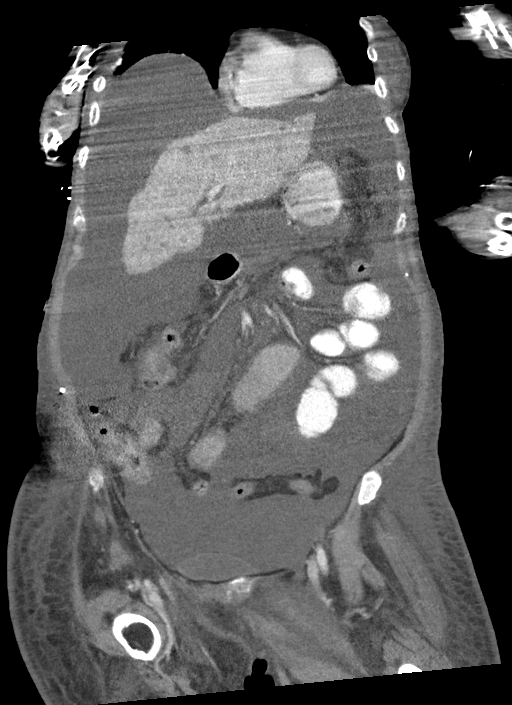
[im 47/85  soft-tissue]
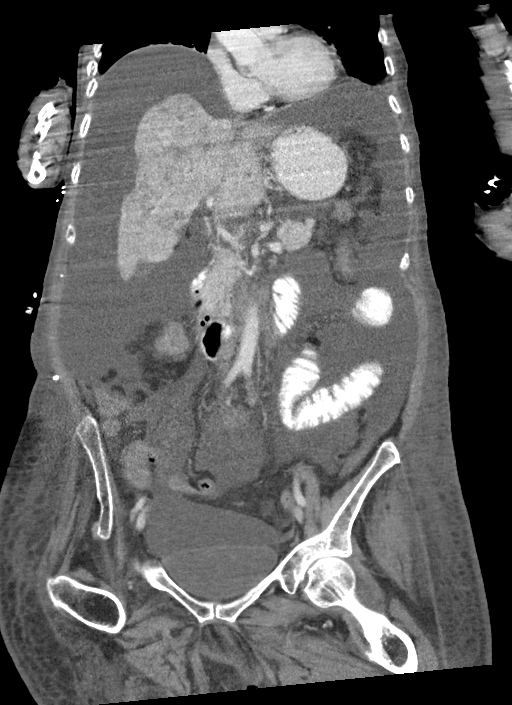

[13 of 46 positions shown; findings below may reference images not displayed]

FINDINGS: Lower chest: Moderate bilateral pleural effusions and passive
atelectasis.

Hepatobiliary: Multiple small hypodense lesions within the liver not
changed from comparison MRI abdomen for technique. The liver has a
shrunken nodular appears consistent cirrhosis. No biliary duct
dilatation. Gallbladder normal.

Pancreas: Pancreas is normal. No ductal dilatation. No pancreatic
inflammation.

Spleen: Normal spleen

Adrenals/urinary tract: Adrenal glands and kidneys are normal. The
ureters and bladder normal.

Stomach/Bowel: Stomach, small bowel, appendix, and cecum are normal.
The colon and rectosigmoid colon are normal.

Vascular/Lymphatic: Stomach and duodenum normal. The oral contrast
progresses slowly through the small bowel. The small bowel is mildly
dilated to 3 cm. There is extensive intraperitoneal free fluid
within the abdomen and pelvis which likely contributes to mild small
bowel wall edema. There is poor progression of the oral contrast.
The ascending colon is collapsed. The transverse and descending
colon are collapsed. No obstructing lesions identified however there
is extensive free fluid which does limit evaluation of the bowel.

Reproductive: Post hysterectomy

Other: Extensive free fluid.

Streak artifact from the generator pack in the RIGHT anterior
abdominal wall.

Musculoskeletal: No aggressive osseous lesion.
IMPRESSION: 1. New large volume intraperitoneal free fluid suggest ascites of
cirrhosis.
2. Dilation of the small bowel and poor progression oral contrast.
Differential includes ileus versus bowel obstruction. No clear bowel
obstructing lesion identified within the small bowel or colon.
3. Numerous small hypodense liver lesions are similar to comparison
MRI on 07/09/2017 at which time metastatic lesions were suspected.
Differential includes metastatic disease versus hepatocellular
carcinoma versus granulomatous disease. Recommend liver tissue
sampling after paracentesis.
